# Patient Record
Sex: Female | Born: 1950 | Race: White | Hispanic: No | Marital: Married | Smoking: Former smoker
Health system: Southern US, Community
[De-identification: ages and names within clinical notes are randomized; demographics above are authoritative.]

## PROBLEM LIST (undated history)

## (undated) DIAGNOSIS — K219 Gastro-esophageal reflux disease without esophagitis: Secondary | ICD-10-CM

## (undated) DIAGNOSIS — M797 Fibromyalgia: Secondary | ICD-10-CM

## (undated) DIAGNOSIS — F329 Major depressive disorder, single episode, unspecified: Secondary | ICD-10-CM

## (undated) DIAGNOSIS — R51 Headache: Secondary | ICD-10-CM

## (undated) DIAGNOSIS — I10 Essential (primary) hypertension: Secondary | ICD-10-CM

## (undated) DIAGNOSIS — F32A Depression, unspecified: Secondary | ICD-10-CM

## (undated) DIAGNOSIS — F419 Anxiety disorder, unspecified: Secondary | ICD-10-CM

## (undated) HISTORY — PX: BACK SURGERY: SHX140

---

## 1971-03-27 HISTORY — PX: TUBAL LIGATION: SHX77

## 1993-03-26 HISTORY — PX: CHOLECYSTECTOMY: SHX55

## 1998-07-21 ENCOUNTER — Encounter: Admission: RE | Admit: 1998-07-21 | Discharge: 1998-10-19 | Payer: Self-pay | Admitting: Anesthesiology

## 1998-09-21 ENCOUNTER — Encounter: Payer: Self-pay | Admitting: Orthopedic Surgery

## 1998-09-26 ENCOUNTER — Encounter: Payer: Self-pay | Admitting: Orthopedic Surgery

## 1998-09-26 ENCOUNTER — Inpatient Hospital Stay (HOSPITAL_COMMUNITY): Admission: RE | Admit: 1998-09-26 | Discharge: 1998-09-27 | Payer: Self-pay | Admitting: Orthopedic Surgery

## 2011-04-26 DIAGNOSIS — A088 Other specified intestinal infections: Secondary | ICD-10-CM | POA: Diagnosis not present

## 2011-05-02 DIAGNOSIS — J019 Acute sinusitis, unspecified: Secondary | ICD-10-CM | POA: Diagnosis not present

## 2011-05-02 DIAGNOSIS — Z6826 Body mass index (BMI) 26.0-26.9, adult: Secondary | ICD-10-CM | POA: Diagnosis not present

## 2011-05-02 DIAGNOSIS — A088 Other specified intestinal infections: Secondary | ICD-10-CM | POA: Diagnosis not present

## 2011-08-13 DIAGNOSIS — I10 Essential (primary) hypertension: Secondary | ICD-10-CM | POA: Diagnosis not present

## 2011-08-13 DIAGNOSIS — Z6827 Body mass index (BMI) 27.0-27.9, adult: Secondary | ICD-10-CM | POA: Diagnosis not present

## 2011-08-13 DIAGNOSIS — M064 Inflammatory polyarthropathy: Secondary | ICD-10-CM | POA: Diagnosis not present

## 2011-08-13 DIAGNOSIS — M1990 Unspecified osteoarthritis, unspecified site: Secondary | ICD-10-CM | POA: Diagnosis not present

## 2011-08-13 DIAGNOSIS — E785 Hyperlipidemia, unspecified: Secondary | ICD-10-CM | POA: Diagnosis not present

## 2011-10-04 DIAGNOSIS — Z6828 Body mass index (BMI) 28.0-28.9, adult: Secondary | ICD-10-CM | POA: Diagnosis not present

## 2011-10-04 DIAGNOSIS — IMO0001 Reserved for inherently not codable concepts without codable children: Secondary | ICD-10-CM | POA: Diagnosis not present

## 2011-10-09 DIAGNOSIS — M329 Systemic lupus erythematosus, unspecified: Secondary | ICD-10-CM | POA: Diagnosis not present

## 2011-11-05 DIAGNOSIS — R894 Abnormal immunological findings in specimens from other organs, systems and tissues: Secondary | ICD-10-CM | POA: Diagnosis not present

## 2011-11-05 DIAGNOSIS — IMO0001 Reserved for inherently not codable concepts without codable children: Secondary | ICD-10-CM | POA: Diagnosis not present

## 2011-11-07 DIAGNOSIS — H524 Presbyopia: Secondary | ICD-10-CM | POA: Diagnosis not present

## 2011-11-07 DIAGNOSIS — H2510 Age-related nuclear cataract, unspecified eye: Secondary | ICD-10-CM | POA: Diagnosis not present

## 2011-11-08 DIAGNOSIS — Z6828 Body mass index (BMI) 28.0-28.9, adult: Secondary | ICD-10-CM | POA: Diagnosis not present

## 2011-11-08 DIAGNOSIS — I10 Essential (primary) hypertension: Secondary | ICD-10-CM | POA: Diagnosis not present

## 2011-11-08 DIAGNOSIS — IMO0001 Reserved for inherently not codable concepts without codable children: Secondary | ICD-10-CM | POA: Diagnosis not present

## 2011-12-10 DIAGNOSIS — R5381 Other malaise: Secondary | ICD-10-CM | POA: Diagnosis not present

## 2011-12-10 DIAGNOSIS — D510 Vitamin B12 deficiency anemia due to intrinsic factor deficiency: Secondary | ICD-10-CM | POA: Diagnosis not present

## 2011-12-10 DIAGNOSIS — R5383 Other fatigue: Secondary | ICD-10-CM | POA: Diagnosis not present

## 2011-12-10 DIAGNOSIS — IMO0001 Reserved for inherently not codable concepts without codable children: Secondary | ICD-10-CM | POA: Diagnosis not present

## 2011-12-10 DIAGNOSIS — I10 Essential (primary) hypertension: Secondary | ICD-10-CM | POA: Diagnosis not present

## 2011-12-20 DIAGNOSIS — Z1231 Encounter for screening mammogram for malignant neoplasm of breast: Secondary | ICD-10-CM | POA: Diagnosis not present

## 2011-12-27 DIAGNOSIS — F329 Major depressive disorder, single episode, unspecified: Secondary | ICD-10-CM | POA: Diagnosis not present

## 2011-12-27 DIAGNOSIS — Z6828 Body mass index (BMI) 28.0-28.9, adult: Secondary | ICD-10-CM | POA: Diagnosis not present

## 2011-12-27 DIAGNOSIS — I10 Essential (primary) hypertension: Secondary | ICD-10-CM | POA: Diagnosis not present

## 2011-12-27 DIAGNOSIS — IMO0001 Reserved for inherently not codable concepts without codable children: Secondary | ICD-10-CM | POA: Diagnosis not present

## 2012-01-24 DIAGNOSIS — F329 Major depressive disorder, single episode, unspecified: Secondary | ICD-10-CM | POA: Diagnosis not present

## 2012-01-24 DIAGNOSIS — R002 Palpitations: Secondary | ICD-10-CM | POA: Diagnosis not present

## 2012-01-24 DIAGNOSIS — Z6828 Body mass index (BMI) 28.0-28.9, adult: Secondary | ICD-10-CM | POA: Diagnosis not present

## 2012-01-24 DIAGNOSIS — I10 Essential (primary) hypertension: Secondary | ICD-10-CM | POA: Diagnosis not present

## 2012-04-15 DIAGNOSIS — IMO0001 Reserved for inherently not codable concepts without codable children: Secondary | ICD-10-CM | POA: Diagnosis not present

## 2012-04-15 DIAGNOSIS — E785 Hyperlipidemia, unspecified: Secondary | ICD-10-CM | POA: Diagnosis not present

## 2012-04-15 DIAGNOSIS — Z6829 Body mass index (BMI) 29.0-29.9, adult: Secondary | ICD-10-CM | POA: Diagnosis not present

## 2012-04-15 DIAGNOSIS — M5137 Other intervertebral disc degeneration, lumbosacral region: Secondary | ICD-10-CM | POA: Diagnosis not present

## 2012-04-15 DIAGNOSIS — R609 Edema, unspecified: Secondary | ICD-10-CM | POA: Diagnosis not present

## 2012-04-17 DIAGNOSIS — M47817 Spondylosis without myelopathy or radiculopathy, lumbosacral region: Secondary | ICD-10-CM | POA: Diagnosis not present

## 2012-04-17 DIAGNOSIS — M5137 Other intervertebral disc degeneration, lumbosacral region: Secondary | ICD-10-CM | POA: Diagnosis not present

## 2012-04-17 DIAGNOSIS — R609 Edema, unspecified: Secondary | ICD-10-CM | POA: Diagnosis not present

## 2012-04-25 DIAGNOSIS — IMO0002 Reserved for concepts with insufficient information to code with codable children: Secondary | ICD-10-CM | POA: Diagnosis not present

## 2012-05-22 DIAGNOSIS — IMO0002 Reserved for concepts with insufficient information to code with codable children: Secondary | ICD-10-CM | POA: Diagnosis not present

## 2012-06-19 DIAGNOSIS — IMO0002 Reserved for concepts with insufficient information to code with codable children: Secondary | ICD-10-CM | POA: Diagnosis not present

## 2012-07-07 DIAGNOSIS — IMO0002 Reserved for concepts with insufficient information to code with codable children: Secondary | ICD-10-CM | POA: Diagnosis not present

## 2012-07-25 DIAGNOSIS — J309 Allergic rhinitis, unspecified: Secondary | ICD-10-CM | POA: Diagnosis not present

## 2012-07-25 DIAGNOSIS — F329 Major depressive disorder, single episode, unspecified: Secondary | ICD-10-CM | POA: Diagnosis not present

## 2012-07-25 DIAGNOSIS — E785 Hyperlipidemia, unspecified: Secondary | ICD-10-CM | POA: Diagnosis not present

## 2012-07-25 DIAGNOSIS — Z6829 Body mass index (BMI) 29.0-29.9, adult: Secondary | ICD-10-CM | POA: Diagnosis not present

## 2012-07-25 DIAGNOSIS — E559 Vitamin D deficiency, unspecified: Secondary | ICD-10-CM | POA: Diagnosis not present

## 2012-07-25 DIAGNOSIS — I10 Essential (primary) hypertension: Secondary | ICD-10-CM | POA: Diagnosis not present

## 2012-10-01 DIAGNOSIS — IMO0002 Reserved for concepts with insufficient information to code with codable children: Secondary | ICD-10-CM | POA: Diagnosis not present

## 2012-10-07 DIAGNOSIS — M4712 Other spondylosis with myelopathy, cervical region: Secondary | ICD-10-CM | POA: Diagnosis not present

## 2012-10-07 DIAGNOSIS — IMO0002 Reserved for concepts with insufficient information to code with codable children: Secondary | ICD-10-CM | POA: Diagnosis not present

## 2012-10-09 DIAGNOSIS — M4802 Spinal stenosis, cervical region: Secondary | ICD-10-CM | POA: Diagnosis not present

## 2012-10-13 ENCOUNTER — Other Ambulatory Visit: Payer: Self-pay | Admitting: Orthopedic Surgery

## 2012-10-21 ENCOUNTER — Encounter (HOSPITAL_COMMUNITY): Payer: Self-pay | Admitting: Pharmacy Technician

## 2012-10-23 NOTE — Pre-Procedure Instructions (Signed)
[facility]  10/23/2012   Your procedure is scheduled on: Wednesday, August 6th    Report to [facility] at  9:00 AM.             (Arrival time is per your surgeon's request)   Call this number if you have problems the morning of surgery: [phone]   Remember:   Do not eat food or drink liquids after midnight Tuesday.   Take these medicines the morning of surgery with A SIP OF WATER: Hydrocodone, Metoprolol, Omeprazole, Zyrtec   Do not wear jewelry, make-up or nail polish.  Do not wear lotions, powders, or perfumes. You may NOT wear deodorant.  Do not shave underarms & legs 48 hours prior to surgery.    Do not bring valuables to the hospital.  [facility] is not responsible for any belongings or valuables.  Contacts, dentures or bridgework may not be worn into surgery.   Leave suitcase in the car. After surgery it may be brought to your room.  For patients admitted to the hospital, checkout time is 11:00 AM the day of discharge.   Name and phone number of your driver:    Special Instructions: Shower using CHG 2 nights before surgery and the night before surgery.  If you shower the day of surgery use CHG.  Use special wash - you have one bottle of CHG for all showers.  You should use approximately 1/3 of the bottle for each shower.   Please read over the following fact sheets that you were given: Pain Booklet, Coughing and Deep Breathing, Blood Transfusion Information, MRSA Information and Surgical Site Infection Prevention

## 2012-10-24 ENCOUNTER — Encounter (HOSPITAL_COMMUNITY): Payer: Self-pay

## 2012-10-24 ENCOUNTER — Encounter (HOSPITAL_COMMUNITY)
Admission: RE | Admit: 2012-10-24 | Discharge: 2012-10-24 | Disposition: A | Discharge status: Home / Self Care | Payer: Medicare Other | Source: Ambulatory Visit | Attending: Orthopedic Surgery | Admitting: Orthopedic Surgery

## 2012-10-24 ENCOUNTER — Ambulatory Visit (HOSPITAL_COMMUNITY)
Admission: RE | Admit: 2012-10-24 | Discharge: 2012-10-24 | Disposition: A | Discharge status: Home / Self Care | Payer: Medicare Other | Source: Ambulatory Visit | Attending: Orthopedic Surgery | Admitting: Orthopedic Surgery

## 2012-10-24 DIAGNOSIS — Z01818 Encounter for other preprocedural examination: Secondary | ICD-10-CM | POA: Insufficient documentation

## 2012-10-24 DIAGNOSIS — Z01810 Encounter for preprocedural cardiovascular examination: Secondary | ICD-10-CM | POA: Diagnosis not present

## 2012-10-24 DIAGNOSIS — I10 Essential (primary) hypertension: Secondary | ICD-10-CM | POA: Insufficient documentation

## 2012-10-24 DIAGNOSIS — I498 Other specified cardiac arrhythmias: Secondary | ICD-10-CM | POA: Diagnosis not present

## 2012-10-24 HISTORY — DX: Major depressive disorder, single episode, unspecified: F32.9

## 2012-10-24 HISTORY — DX: Fibromyalgia: M79.7

## 2012-10-24 HISTORY — DX: Depression, unspecified: F32.A

## 2012-10-24 HISTORY — DX: Headache: R51

## 2012-10-24 HISTORY — DX: Anxiety disorder, unspecified: F41.9

## 2012-10-24 HISTORY — DX: Essential (primary) hypertension: I10

## 2012-10-24 HISTORY — DX: Gastro-esophageal reflux disease without esophagitis: K21.9

## 2012-10-24 LAB — CBC WITH DIFFERENTIAL/PLATELET
Eosinophils Relative: 2 % (ref 0–5)
Lymphocytes Relative: 25 % (ref 12–46)
Lymphs Abs: 2.1 10*3/uL (ref 0.7–4.0)
MCV: 88.5 fL (ref 78.0–100.0)
Neutro Abs: 5.4 10*3/uL (ref 1.7–7.7)
Platelets: 251 10*3/uL (ref 150–400)
RBC: 3.47 MIL/uL — ABNORMAL LOW (ref 3.87–5.11)
WBC: 8.3 10*3/uL (ref 4.0–10.5)

## 2012-10-24 LAB — COMPREHENSIVE METABOLIC PANEL
ALT: 6 U/L (ref 0–35)
Alkaline Phosphatase: 81 U/L (ref 39–117)
CO2: 31 mEq/L (ref 19–32)
Chloride: 102 mEq/L (ref 96–112)
GFR calc Af Amer: 56 mL/min — ABNORMAL LOW (ref >90)
GFR calc non Af Amer: 48 mL/min — ABNORMAL LOW (ref >90)
Glucose, Bld: 92 mg/dL (ref 70–99)
Potassium: 5.3 mEq/L — ABNORMAL HIGH (ref 3.5–5.1)
Sodium: 139 mEq/L (ref 135–145)
Total Bilirubin: 0.5 mg/dL (ref 0.3–1.2)

## 2012-10-24 LAB — URINALYSIS, ROUTINE W REFLEX MICROSCOPIC
Glucose, UA: NEGATIVE mg/dL
Specific Gravity, Urine: 1.02 (ref 1.005–1.030)

## 2012-10-24 LAB — SURGICAL PCR SCREEN: MRSA, PCR: NEGATIVE

## 2012-10-24 LAB — URINE MICROSCOPIC-ADD ON

## 2012-10-24 LAB — APTT: aPTT: 30 seconds (ref 24–37)

## 2012-10-27 NOTE — Progress Notes (Signed)
Messages left at both [facility] office with his scheduler and with pt informing them that pt was positive for staph on her PCR and to call in prescription/begin mupirocin. Phone number for short stay left at both places.

## 2012-10-28 MED ORDER — POVIDONE-IODINE 7.5 % EX SOLN
Freq: Once | CUTANEOUS | Status: DC
  Filled 2012-10-28: qty 118

## 2012-10-28 MED ORDER — CEFAZOLIN SODIUM-DEXTROSE 2-3 GM-% IV SOLR
2.0000 g | INTRAVENOUS | Status: AC
  Administered 2012-10-29: 2 g via INTRAVENOUS
  Filled 2012-10-28: qty 50

## 2012-10-29 ENCOUNTER — Inpatient Hospital Stay (HOSPITAL_COMMUNITY): Payer: Medicare Other

## 2012-10-29 ENCOUNTER — Inpatient Hospital Stay (HOSPITAL_COMMUNITY): Payer: Medicare Other | Admitting: Anesthesiology

## 2012-10-29 ENCOUNTER — Encounter (HOSPITAL_COMMUNITY): Payer: Self-pay | Admitting: Anesthesiology

## 2012-10-29 ENCOUNTER — Inpatient Hospital Stay (HOSPITAL_COMMUNITY)
Admission: RE | Admit: 2012-10-29 | Discharge: 2012-11-01 | DRG: 455 | Disposition: A | Discharge status: Home / Self Care | Payer: Medicare Other | Source: Ambulatory Visit | Attending: Orthopedic Surgery | Admitting: Orthopedic Surgery

## 2012-10-29 ENCOUNTER — Encounter (HOSPITAL_COMMUNITY)
Admission: RE | Disposition: A | Discharge status: Home / Self Care | Payer: Self-pay | Source: Ambulatory Visit | Attending: Orthopedic Surgery

## 2012-10-29 ENCOUNTER — Encounter (HOSPITAL_COMMUNITY): Payer: Self-pay | Admitting: *Deleted

## 2012-10-29 DIAGNOSIS — M5137 Other intervertebral disc degeneration, lumbosacral region: Secondary | ICD-10-CM | POA: Diagnosis not present

## 2012-10-29 DIAGNOSIS — M48061 Spinal stenosis, lumbar region without neurogenic claudication: Secondary | ICD-10-CM | POA: Diagnosis not present

## 2012-10-29 DIAGNOSIS — K219 Gastro-esophageal reflux disease without esophagitis: Secondary | ICD-10-CM | POA: Diagnosis not present

## 2012-10-29 DIAGNOSIS — Q762 Congenital spondylolisthesis: Secondary | ICD-10-CM

## 2012-10-29 DIAGNOSIS — F3289 Other specified depressive episodes: Secondary | ICD-10-CM | POA: Diagnosis present

## 2012-10-29 DIAGNOSIS — Z981 Arthrodesis status: Secondary | ICD-10-CM | POA: Diagnosis not present

## 2012-10-29 DIAGNOSIS — Z87891 Personal history of nicotine dependence: Secondary | ICD-10-CM

## 2012-10-29 DIAGNOSIS — IMO0002 Reserved for concepts with insufficient information to code with codable children: Secondary | ICD-10-CM | POA: Diagnosis not present

## 2012-10-29 DIAGNOSIS — IMO0001 Reserved for inherently not codable concepts without codable children: Secondary | ICD-10-CM | POA: Diagnosis present

## 2012-10-29 DIAGNOSIS — M48062 Spinal stenosis, lumbar region with neurogenic claudication: Secondary | ICD-10-CM | POA: Diagnosis not present

## 2012-10-29 DIAGNOSIS — I10 Essential (primary) hypertension: Secondary | ICD-10-CM | POA: Diagnosis not present

## 2012-10-29 DIAGNOSIS — M533 Sacrococcygeal disorders, not elsewhere classified: Secondary | ICD-10-CM | POA: Diagnosis not present

## 2012-10-29 DIAGNOSIS — F329 Major depressive disorder, single episode, unspecified: Secondary | ICD-10-CM | POA: Diagnosis present

## 2012-10-29 DIAGNOSIS — F411 Generalized anxiety disorder: Secondary | ICD-10-CM | POA: Diagnosis present

## 2012-10-29 DIAGNOSIS — Z79899 Other long term (current) drug therapy: Secondary | ICD-10-CM

## 2012-10-29 DIAGNOSIS — M545 Low back pain: Secondary | ICD-10-CM | POA: Diagnosis not present

## 2012-10-29 DIAGNOSIS — M549 Dorsalgia, unspecified: Secondary | ICD-10-CM | POA: Diagnosis not present

## 2012-10-29 DIAGNOSIS — M79609 Pain in unspecified limb: Secondary | ICD-10-CM | POA: Diagnosis not present

## 2012-10-29 DIAGNOSIS — M4310 Spondylolisthesis, site unspecified: Secondary | ICD-10-CM | POA: Diagnosis not present

## 2012-10-29 SURGERY — POSTERIOR LUMBAR FUSION 1 LEVEL
Anesthesia: General | Site: Spine Lumbar | Wound class: Clean

## 2012-10-29 MED ORDER — HYDROMORPHONE HCL PF 1 MG/ML IJ SOLN
INTRAMUSCULAR | Status: AC
  Administered 2012-10-29: 0.5 mg via INTRAVENOUS
  Filled 2012-10-29: qty 1

## 2012-10-29 MED ORDER — MAGNESIUM OXIDE 400 (241.3 MG) MG PO TABS
400.0000 mg | ORAL_TABLET | Freq: Every day | ORAL | Status: DC
  Administered 2012-10-30 – 2012-10-31 (×2): 400 mg via ORAL
  Filled 2012-10-29 (×4): qty 1

## 2012-10-29 MED ORDER — ONDANSETRON HCL 4 MG/2ML IJ SOLN: 4.0000 mg | INTRAMUSCULAR | Status: DC | PRN

## 2012-10-29 MED ORDER — SENNOSIDES-DOCUSATE SODIUM 8.6-50 MG PO TABS: 1.0000 | ORAL_TABLET | Freq: Every evening | ORAL | Status: DC | PRN

## 2012-10-29 MED ORDER — SODIUM CHLORIDE 0.9 % IJ SOLN: 3.0000 mL | INTRAMUSCULAR | Status: DC | PRN

## 2012-10-29 MED ORDER — SUCCINYLCHOLINE CHLORIDE 20 MG/ML IJ SOLN
INTRAMUSCULAR | Status: DC | PRN
  Administered 2012-10-29: 100 mg via INTRAVENOUS

## 2012-10-29 MED ORDER — OXYCODONE HCL 5 MG PO TABS
ORAL_TABLET | ORAL | Status: AC
  Administered 2012-10-29: 5 mg via ORAL
  Filled 2012-10-29: qty 1

## 2012-10-29 MED ORDER — ZOLPIDEM TARTRATE 5 MG PO TABS: 5.0000 mg | ORAL_TABLET | Freq: Every evening | ORAL | Status: DC | PRN

## 2012-10-29 MED ORDER — THROMBIN 20000 UNITS EX SOLR
CUTANEOUS | Status: AC
  Filled 2012-10-29: qty 20000

## 2012-10-29 MED ORDER — ONDANSETRON HCL 4 MG/2ML IJ SOLN: 4.0000 mg | Freq: Four times a day (QID) | INTRAMUSCULAR | Status: DC | PRN

## 2012-10-29 MED ORDER — PREGABALIN 25 MG PO CAPS
75.0000 mg | ORAL_CAPSULE | Freq: Every day | ORAL | Status: DC
  Administered 2012-10-29 – 2012-10-31 (×3): 75 mg via ORAL
  Filled 2012-10-29 (×3): qty 3

## 2012-10-29 MED ORDER — MORPHINE SULFATE (PF) 1 MG/ML IV SOLN
INTRAVENOUS | Status: AC
  Filled 2012-10-29: qty 25

## 2012-10-29 MED ORDER — PROPOFOL 10 MG/ML IV BOLUS
INTRAVENOUS | Status: DC | PRN
  Administered 2012-10-29: 50 mg via INTRAVENOUS
  Administered 2012-10-29: 120 mg via INTRAVENOUS
  Administered 2012-10-29: 30 mg via INTRAVENOUS

## 2012-10-29 MED ORDER — MIDAZOLAM HCL 5 MG/5ML IJ SOLN
INTRAMUSCULAR | Status: DC | PRN
  Administered 2012-10-29: 2 mg via INTRAVENOUS

## 2012-10-29 MED ORDER — DIAZEPAM 5 MG PO TABS
ORAL_TABLET | ORAL | Status: AC
  Administered 2012-10-29: 5 mg via ORAL
  Filled 2012-10-29: qty 1

## 2012-10-29 MED ORDER — OMEPRAZOLE MAGNESIUM 20 MG PO TBEC
20.0000 mg | DELAYED_RELEASE_TABLET | Freq: Every day | ORAL | Status: DC
Start: 2012-10-29 — End: 2012-10-29

## 2012-10-29 MED ORDER — CITALOPRAM HYDROBROMIDE 20 MG PO TABS
20.0000 mg | ORAL_TABLET | Freq: Every day | ORAL | Status: DC
Start: 2012-10-29 — End: 2012-11-01
  Administered 2012-10-30 – 2012-10-31 (×2): 20 mg via ORAL
  Filled 2012-10-29 (×4): qty 1

## 2012-10-29 MED ORDER — DIPHENHYDRAMINE HCL 12.5 MG/5ML PO ELIX: 12.5000 mg | ORAL_SOLUTION | Freq: Four times a day (QID) | ORAL | Status: DC | PRN

## 2012-10-29 MED ORDER — 0.9 % SODIUM CHLORIDE (POUR BTL) OPTIME
TOPICAL | Status: DC | PRN
  Administered 2012-10-29: 1000 mL

## 2012-10-29 MED ORDER — FLEET ENEMA 7-19 GM/118ML RE ENEM: 1.0000 | ENEMA | Freq: Once | RECTAL | Status: AC | PRN

## 2012-10-29 MED ORDER — DIPHENHYDRAMINE HCL 50 MG/ML IJ SOLN: 12.5000 mg | Freq: Four times a day (QID) | INTRAMUSCULAR | Status: DC | PRN

## 2012-10-29 MED ORDER — HYDROMORPHONE HCL PF 1 MG/ML IJ SOLN
0.2500 mg | INTRAMUSCULAR | Status: DC | PRN
  Administered 2012-10-29: 0.5 mg via INTRAVENOUS

## 2012-10-29 MED ORDER — SIMVASTATIN 20 MG PO TABS
20.0000 mg | ORAL_TABLET | Freq: Every day | ORAL | Status: DC
  Administered 2012-10-29 – 2012-10-31 (×3): 20 mg via ORAL
  Filled 2012-10-29 (×4): qty 1

## 2012-10-29 MED ORDER — VANCOMYCIN HCL IN DEXTROSE 1-5 GM/200ML-% IV SOLN
1000.0000 mg | Freq: Once | INTRAVENOUS | Status: AC
  Administered 2012-10-30: 1000 mg via INTRAVENOUS
  Filled 2012-10-29: qty 200

## 2012-10-29 MED ORDER — SODIUM CHLORIDE 0.9 % IV SOLN: 250.0000 mL | INTRAVENOUS | Status: DC

## 2012-10-29 MED ORDER — OXYCODONE HCL 5 MG/5ML PO SOLN: 5.0000 mg | Freq: Once | ORAL | Status: AC | PRN

## 2012-10-29 MED ORDER — LORATADINE 10 MG PO TABS
10.0000 mg | ORAL_TABLET | Freq: Every day | ORAL | Status: DC
  Administered 2012-10-30 – 2012-10-31 (×2): 10 mg via ORAL
  Filled 2012-10-29 (×3): qty 1

## 2012-10-29 MED ORDER — FENTANYL CITRATE 0.05 MG/ML IJ SOLN
INTRAMUSCULAR | Status: DC | PRN
  Administered 2012-10-29 (×5): 50 ug via INTRAVENOUS
  Administered 2012-10-29: 150 ug via INTRAVENOUS
  Administered 2012-10-29: 100 ug via INTRAVENOUS

## 2012-10-29 MED ORDER — SODIUM CHLORIDE 0.9 % IV SOLN
INTRAVENOUS | Status: DC
  Administered 2012-10-30: via INTRAVENOUS

## 2012-10-29 MED ORDER — MORPHINE SULFATE (PF) 1 MG/ML IV SOLN
INTRAVENOUS | Status: DC
  Administered 2012-10-29: 1.5 mg via INTRAVENOUS
  Administered 2012-10-29: 17:00:00 via INTRAVENOUS
  Administered 2012-10-30: 4.5 mg via INTRAVENOUS
  Administered 2012-10-30: 1.5 mg via INTRAVENOUS

## 2012-10-29 MED ORDER — MENTHOL 3 MG MT LOZG: 1.0000 | LOZENGE | OROMUCOSAL | Status: DC | PRN

## 2012-10-29 MED ORDER — MAGNESIUM OXIDE 400 MG PO TABS: 400.0000 mg | ORAL_TABLET | Freq: Every day | ORAL | Status: DC

## 2012-10-29 MED ORDER — SODIUM CHLORIDE 0.9 % IJ SOLN: 9.0000 mL | INTRAMUSCULAR | Status: DC | PRN

## 2012-10-29 MED ORDER — DIAZEPAM 5 MG PO TABS
5.0000 mg | ORAL_TABLET | Freq: Four times a day (QID) | ORAL | Status: DC | PRN
  Filled 2012-10-29: qty 1

## 2012-10-29 MED ORDER — LACTATED RINGERS IV SOLN
INTRAVENOUS | Status: DC
  Administered 2012-10-29: 09:00:00 via INTRAVENOUS

## 2012-10-29 MED ORDER — OXYCODONE-ACETAMINOPHEN 5-325 MG PO TABS
1.0000 | ORAL_TABLET | ORAL | Status: DC | PRN
  Administered 2012-10-30 (×2): 1 via ORAL
  Filled 2012-10-29 (×2): qty 1

## 2012-10-29 MED ORDER — DOCUSATE SODIUM 100 MG PO CAPS
100.0000 mg | ORAL_CAPSULE | Freq: Two times a day (BID) | ORAL | Status: DC
  Administered 2012-10-29 – 2012-10-31 (×5): 100 mg via ORAL
  Filled 2012-10-29 (×7): qty 1

## 2012-10-29 MED ORDER — SODIUM CHLORIDE 0.9 % IJ SOLN
3.0000 mL | Freq: Two times a day (BID) | INTRAMUSCULAR | Status: DC
  Administered 2012-10-31 (×2): 3 mL via INTRAVENOUS

## 2012-10-29 MED ORDER — ACETAMINOPHEN 650 MG RE SUPP: 650.0000 mg | RECTAL | Status: DC | PRN

## 2012-10-29 MED ORDER — PROPOFOL INFUSION 10 MG/ML OPTIME
INTRAVENOUS | Status: DC | PRN
  Administered 2012-10-29: 75 ug/kg/min via INTRAVENOUS

## 2012-10-29 MED ORDER — LACTATED RINGERS IV SOLN
INTRAVENOUS | Status: DC | PRN
  Administered 2012-10-29 (×2): via INTRAVENOUS

## 2012-10-29 MED ORDER — ACETAMINOPHEN 325 MG PO TABS
650.0000 mg | ORAL_TABLET | ORAL | Status: DC | PRN
  Administered 2012-10-30 – 2012-10-31 (×2): 650 mg via ORAL
  Filled 2012-10-29 (×2): qty 2

## 2012-10-29 MED ORDER — LISINOPRIL 20 MG PO TABS
20.0000 mg | ORAL_TABLET | Freq: Every day | ORAL | Status: DC
  Administered 2012-10-29 – 2012-10-30 (×2): 20 mg via ORAL
  Filled 2012-10-29 (×4): qty 1

## 2012-10-29 MED ORDER — PHENOL 1.4 % MT LIQD: 1.0000 | OROMUCOSAL | Status: DC | PRN

## 2012-10-29 MED ORDER — BISACODYL 5 MG PO TBEC: 5.0000 mg | DELAYED_RELEASE_TABLET | Freq: Every day | ORAL | Status: DC | PRN

## 2012-10-29 MED ORDER — MORPHINE SULFATE 2 MG/ML IJ SOLN: 1.0000 mg | INTRAMUSCULAR | Status: DC | PRN

## 2012-10-29 MED ORDER — NALOXONE HCL 0.4 MG/ML IJ SOLN: 0.4000 mg | INTRAMUSCULAR | Status: DC | PRN

## 2012-10-29 MED ORDER — BUPIVACAINE-EPINEPHRINE 0.25% -1:200000 IJ SOLN
INTRAMUSCULAR | Status: DC | PRN
  Administered 2012-10-29: 6 mL

## 2012-10-29 MED ORDER — THROMBIN 20000 UNITS EX SOLR
CUTANEOUS | Status: DC | PRN
  Administered 2012-10-29: 14:00:00 via TOPICAL

## 2012-10-29 MED ORDER — LIDOCAINE HCL (CARDIAC) 20 MG/ML IV SOLN
INTRAVENOUS | Status: DC | PRN
  Administered 2012-10-29: 50 mg via INTRAVENOUS

## 2012-10-29 MED ORDER — METOPROLOL TARTRATE 50 MG PO TABS
50.0000 mg | ORAL_TABLET | Freq: Two times a day (BID) | ORAL | Status: DC
  Administered 2012-10-29 – 2012-10-31 (×4): 50 mg via ORAL
  Filled 2012-10-29 (×7): qty 1

## 2012-10-29 MED ORDER — OXYCODONE HCL 5 MG PO TABS: 5.0000 mg | ORAL_TABLET | Freq: Once | ORAL | Status: AC | PRN

## 2012-10-29 MED ORDER — BUPIVACAINE-EPINEPHRINE PF 0.25-1:200000 % IJ SOLN
INTRAMUSCULAR | Status: AC
  Filled 2012-10-29: qty 30

## 2012-10-29 MED ORDER — ALUM & MAG HYDROXIDE-SIMETH 200-200-20 MG/5ML PO SUSP: 30.0000 mL | Freq: Four times a day (QID) | ORAL | Status: DC | PRN

## 2012-10-29 MED ORDER — POTASSIUM GLUCONATE 595 (99 K) MG PO TABS: 595.0000 mg | ORAL_TABLET | Freq: Every day | ORAL | Status: DC

## 2012-10-29 SURGICAL SUPPLY — 86 items
APL SKNCLS STERI-STRIP NONHPOA (GAUZE/BANDAGES/DRESSINGS) ×2
BENZOIN TINCTURE PRP APPL 2/3 (GAUZE/BANDAGES/DRESSINGS) ×4 IMPLANT
BLADE SURG ROTATE 9660 (MISCELLANEOUS) IMPLANT
BUR ROUND PRECISION 4.0 (BURR) ×2 IMPLANT
CARTRIDGE OIL MAESTRO DRILL (MISCELLANEOUS) ×1 IMPLANT
CLOTH BEACON ORANGE TIMEOUT ST (SAFETY) ×2 IMPLANT
CLSR STERI-STRIP ANTIMIC 1/2X4 (GAUZE/BANDAGES/DRESSINGS) ×4 IMPLANT
CONT SPEC STER OR (MISCELLANEOUS) ×2 IMPLANT
CORDS BIPOLAR (ELECTRODE) ×2 IMPLANT
COVER SURGICAL LIGHT HANDLE (MISCELLANEOUS) ×2 IMPLANT
DIFFUSER DRILL AIR PNEUMATIC (MISCELLANEOUS) ×2 IMPLANT
DRAIN CHANNEL 15F RND FF W/TCR (WOUND CARE) IMPLANT
DRAPE C-ARM 42X72 X-RAY (DRAPES) ×2 IMPLANT
DRAPE C-ARMOR (DRAPES) ×2 IMPLANT
DRAPE ORTHO SPLIT 77X108 STRL (DRAPES) ×2
DRAPE POUCH INSTRU U-SHP 10X18 (DRAPES) ×2 IMPLANT
DRAPE SURG 17X23 STRL (DRAPES) ×6 IMPLANT
DRAPE SURG ORHT 6 SPLT 77X108 (DRAPES) ×1 IMPLANT
DURAPREP 26ML APPLICATOR (WOUND CARE) ×2 IMPLANT
ELECT BLADE 4.0 EZ CLEAN MEGAD (MISCELLANEOUS) ×2
ELECT CAUTERY BLADE 6.4 (BLADE) ×2 IMPLANT
ELECT REM PT RETURN 9FT ADLT (ELECTROSURGICAL) ×2
ELECTRODE BLDE 4.0 EZ CLN MEGD (MISCELLANEOUS) ×1 IMPLANT
ELECTRODE REM PT RTRN 9FT ADLT (ELECTROSURGICAL) ×1 IMPLANT
EVACUATOR SILICONE 100CC (DRAIN) IMPLANT
GAUZE SPONGE 4X4 16PLY XRAY LF (GAUZE/BANDAGES/DRESSINGS) ×8 IMPLANT
GLOVE BIO SURGEON STRL SZ7 (GLOVE) ×2 IMPLANT
GLOVE BIO SURGEON STRL SZ8 (GLOVE) ×2 IMPLANT
GLOVE BIOGEL PI IND STRL 7.5 (GLOVE) ×1 IMPLANT
GLOVE BIOGEL PI IND STRL 8 (GLOVE) ×1 IMPLANT
GLOVE BIOGEL PI INDICATOR 7.5 (GLOVE) ×1
GLOVE BIOGEL PI INDICATOR 8 (GLOVE) ×1
GOWN STRL NON-REIN LRG LVL3 (GOWN DISPOSABLE) ×4 IMPLANT
GOWN STRL REIN XL XLG (GOWN DISPOSABLE) ×2 IMPLANT
GUIDEWIRE SHARP VIPER II (WIRE) ×4 IMPLANT
IMPL COROENT XL 10X18X55 (Intraocular Lens) ×1 IMPLANT
IMPLANT COROENT XL 10X18X55 (Intraocular Lens) ×2 IMPLANT
IV CATH 14GX2 1/4 (CATHETERS) ×2 IMPLANT
KIT BASIN OR (CUSTOM PROCEDURE TRAY) ×2 IMPLANT
KIT DILATOR XLIF 5 (KITS) ×4 IMPLANT
KIT MAXCESS (KITS) ×2 IMPLANT
KIT NEEDLE NVM5 EMG ELECT (KITS) ×1 IMPLANT
KIT NEEDLE NVM5 EMG ELECTRODE (KITS) ×1
KIT POSITION SURG JACKSON T1 (MISCELLANEOUS) ×2 IMPLANT
KIT ROOM TURNOVER OR (KITS) ×2 IMPLANT
KIT XLIF (KITS) ×4
MARKER SKIN DUAL TIP RULER LAB (MISCELLANEOUS) ×4 IMPLANT
NEEDLE BONE MARROW 8GX6 FENEST (NEEDLE) IMPLANT
NEEDLE HYPO 25GX1X1/2 BEV (NEEDLE) ×2 IMPLANT
NEEDLE JAMSHIDI VIPER (NEEDLE) ×2 IMPLANT
NEEDLE SPNL 18GX3.5 QUINCKE PK (NEEDLE) ×4 IMPLANT
NS IRRIG 1000ML POUR BTL (IV SOLUTION) ×2 IMPLANT
OIL CARTRIDGE MAESTRO DRILL (MISCELLANEOUS) ×2
PACK LAMINECTOMY ORTHO (CUSTOM PROCEDURE TRAY) ×2 IMPLANT
PACK UNIVERSAL I (CUSTOM PROCEDURE TRAY) ×2 IMPLANT
PAD ARMBOARD 7.5X6 YLW CONV (MISCELLANEOUS) ×4 IMPLANT
PATTIES SURGICAL .5 X1 (DISPOSABLE) ×2 IMPLANT
PATTIES SURGICAL .5X1.5 (GAUZE/BANDAGES/DRESSINGS) ×2 IMPLANT
PUTTY BONE DBX 2.5 MIS (Bone Implant) ×2 IMPLANT
PUTTY BONE DBX 5CC MIX (Putty) ×2 IMPLANT
ROD VIPER II LORDOSED 5.5X35 (Rod) ×2 IMPLANT
SCREW SET SINGLE INNER MIS (Screw) ×4 IMPLANT
SCREW XTAB POLY VIPER  6X45 (Screw) ×2 IMPLANT
SCREW XTAB POLY VIPER 6X45 (Screw) ×2 IMPLANT
SPONGE GAUZE 4X4 12PLY (GAUZE/BANDAGES/DRESSINGS) ×4 IMPLANT
SPONGE INTESTINAL PEANUT (DISPOSABLE) ×6 IMPLANT
SPONGE SURGIFOAM ABS GEL 100 (HEMOSTASIS) ×2 IMPLANT
STAPLER VISISTAT 35W (STAPLE) ×2 IMPLANT
STRIP CLOSURE SKIN 1/2X4 (GAUZE/BANDAGES/DRESSINGS) ×4 IMPLANT
SURGIFLO TRUKIT (HEMOSTASIS) IMPLANT
SUT MNCRL AB 4-0 PS2 18 (SUTURE) ×4 IMPLANT
SUT VIC AB 0 CT1 18XCR BRD 8 (SUTURE) ×1 IMPLANT
SUT VIC AB 0 CT1 8-18 (SUTURE) ×2
SUT VIC AB 1 CT1 18XCR BRD 8 (SUTURE) ×2 IMPLANT
SUT VIC AB 1 CT1 8-18 (SUTURE) ×4
SUT VIC AB 2-0 CT2 18 VCP726D (SUTURE) ×2 IMPLANT
SYR 20CC LL (SYRINGE) ×2 IMPLANT
SYR BULB IRRIGATION 50ML (SYRINGE) ×2 IMPLANT
SYR CONTROL 10ML LL (SYRINGE) ×2 IMPLANT
TAP CANN VIPER2 DL 5.0 (TAP) ×2 IMPLANT
TAPE CLOTH SURG 4X10 WHT LF (GAUZE/BANDAGES/DRESSINGS) ×4 IMPLANT
TOWEL OR 17X24 6PK STRL BLUE (TOWEL DISPOSABLE) ×2 IMPLANT
TOWEL OR 17X26 10 PK STRL BLUE (TOWEL DISPOSABLE) ×2 IMPLANT
TRAY FOLEY CATH 16FRSI W/METER (SET/KITS/TRAYS/PACK) ×2 IMPLANT
WATER STERILE IRR 1000ML POUR (IV SOLUTION) ×2 IMPLANT
YANKAUER SUCT BULB TIP NO VENT (SUCTIONS) ×2 IMPLANT

## 2012-10-29 NOTE — Anesthesia Postprocedure Evaluation (Signed)
  Anesthesia Post-op Note  Patient: [name]  Procedure(s) Performed: Procedure(s) with comments: Lumbar 4-5 lateral interbody fusion with allograft and posterior spinal fusion with instrumentation./POSTERIOR LUMBAR FUSION 1 LEVEL (N/A) - Lumbar 4-5 lateral interbody fusion with allograft and posterior spinal fusion with instrumentation.  Patient Location: PACU  Anesthesia Type:General  Level of Consciousness: awake  Airway and Oxygen Therapy: Patient Spontanous Breathing  Post-op Pain: mild  Post-op Assessment: Post-op Vital signs reviewed  Post-op Vital Signs: Reviewed  Complications: No apparent anesthesia complications

## 2012-10-29 NOTE — Plan of Care (Signed)
Problem: Consults Goal: Diagnosis - Spinal Surgery Lumbar Laminectomy (Complex) PLIF L4-5

## 2012-10-29 NOTE — Preoperative (Signed)
Beta Blockers   Reason not to administer Beta Blockers:Not Applicable 

## 2012-10-29 NOTE — Transfer of Care (Signed)
Immediate Anesthesia Transfer of Care Note  Patient: [name]  Procedure(s) Performed: Procedure(s) with comments: Lumbar 4-5 lateral interbody fusion with allograft and posterior spinal fusion with instrumentation./POSTERIOR LUMBAR FUSION 1 LEVEL (N/A) - Lumbar 4-5 lateral interbody fusion with allograft and posterior spinal fusion with instrumentation.  Patient Location: PACU  Anesthesia Type:General  Level of Consciousness: awake  Airway & Oxygen Therapy: Patient Spontanous Breathing and Patient connected to nasal cannula oxygen  Post-op Assessment: Report given to PACU RN and Post -op Vital signs reviewed and stable  Post vital signs: Reviewed and stable  Complications: No apparent anesthesia complications

## 2012-10-29 NOTE — Anesthesia Preprocedure Evaluation (Signed)
Anesthesia Evaluation  Patient identified by MRN, date of birth, ID band Patient awake    Reviewed: Allergy & Precautions, H&P , NPO status , Patient's Chart, lab work & pertinent test results  Airway Mallampati: II  Neck ROM: full    Dental   Pulmonary former smoker,          Cardiovascular hypertension,     Neuro/Psych  Headaches, Anxiety Depression  Neuromuscular disease    GI/Hepatic GERD-  ,  Endo/Other    Renal/GU      Musculoskeletal  (+) Fibromyalgia -  Abdominal   Peds  Hematology   Anesthesia Other Findings   Reproductive/Obstetrics                           Anesthesia Physical Anesthesia Plan  ASA: III  Anesthesia Plan: General   Post-op Pain Management:    Induction: Intravenous  Airway Management Planned: Oral ETT  Additional Equipment:   Intra-op Plan:   Post-operative Plan: Extubation in OR  Informed Consent: I have reviewed the patients History and Physical, chart, labs and discussed the procedure including the risks, benefits and alternatives for the proposed anesthesia with the patient or authorized representative who has indicated his/her understanding and acceptance.     Plan Discussed with: CRNA, Anesthesiologist and Surgeon  Anesthesia Plan Comments:         Anesthesia Quick Evaluation

## 2012-10-29 NOTE — H&P (Signed)
PREOPERATIVE H&P  Chief Complaint: left leg pain  HPI: [name] is a 62 y.o. female who presents with ongoing left leg pain. S/p 2 previous lumbar decompressions. 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 Patient has failed multiple forms of conservative care.  Past Medical History  Diagnosis Date  . Hypertension   . GERD (gastroesophageal reflux disease)   . Headache(784.0)     last one was 'acouple' yrs  . Anxiety   . Depression     on meds for 'acouple of yrs now"  . Fibromyalgia    Past Surgical History  Procedure Laterality Date  . Back surgery  2000 & 2001  . Cholecystectomy  1995  . Tubal ligation  1973   History   Social History  . Marital Status: Married    Spouse Name: N/A    Number of Children: N/A  . Years of Education: N/A   Social History Main Topics  . Smoking status: Former Smoker -- 1.00 packs/day for 40 years    Types: Cigarettes    Quit date: 03/26/2008  . Smokeless tobacco: Not on file  . Alcohol Use: No  . Drug Use: No  . Sexually Active: Not on file   Other Topics Concern  . Not on file   Social History Narrative  . No narrative on file   No family history on file. Allergies  Allergen Reactions  . Penicillins Other (See Comments)    Causes thrush  . Other Rash    Ivory soap causes rash   Prior to Admission medications   Medication Sig Start Date End Date Taking? Authorizing Provider  Calcium Carb-Cholecalciferol (CALCIUM-VITAMIN D3) 600-500 MG-UNIT CAPS Take 1 capsule by mouth daily.   Yes Historical Provider, MD  cetirizine (ZYRTEC) 10 MG tablet Take 10 mg by mouth 2 (two) times daily.   Yes Historical Provider, MD  citalopram (CELEXA) 20 MG tablet Take 20 mg by mouth daily.   Yes Historical Provider, MD  HYDROcodone-acetaminophen (NORCO) 10-325 MG per tablet Take 1 tablet by mouth every 6 (six) hours as needed for pain.   Yes Historical Provider, MD  lisinopril (PRINIVIL,ZESTRIL) 20 MG tablet Take 20 mg by mouth at  bedtime.   Yes Historical Provider, MD  magnesium oxide (MAG-OX) 400 MG tablet Take 400 mg by mouth daily.   Yes Historical Provider, MD  metoprolol (LOPRESSOR) 50 MG tablet Take 50 mg by mouth 2 (two) times daily.   Yes Historical Provider, MD  Multiple Vitamin (MULTIVITAMIN WITH MINERALS) TABS Take 1 tablet by mouth daily. Women's over 50   Yes Historical Provider, MD  Omega-3 Fatty Acids (FISH OIL PO) Take 1,290 mg by mouth 2 (two) times daily.   Yes Historical Provider, MD  omeprazole (PRILOSEC OTC) 20 MG tablet Take 20 mg by mouth daily.   Yes Historical Provider, MD  OVER THE COUNTER MEDICATION Take 1 tablet by mouth at bedtime. Protandim (dietary supplement)   Yes Historical Provider, MD  potassium gluconate 595 MG TABS Take 595 mg by mouth daily.   Yes Historical Provider, MD  pregabalin (LYRICA) 75 MG capsule Take 75 mg by mouth at bedtime.   Yes Historical Provider, MD  simvastatin (ZOCOR) 20 MG tablet Take 20 mg by mouth at bedtime.   Yes Historical Provider, MD     All other systems have been reviewed and were otherwise negative with the exception of those mentioned in the HPI and as above.  Physical Exam: There were no vitals  filed for this visit.  General: Alert, no acute distress Cardiovascular: No pedal edema Respiratory: No cyanosis, no use of accessory musculature Skin: No lesions in the area of chief complaint Neurologic: Sensation intact distally Psychiatric: Patient is competent for consent with normal mood and affect Lymphatic: No axillary or cervical lymphadenopathy  MUSCULOSKELETAL: + SLR on left  Assessment/Plan: left leg pain Plan for Procedure(s): Lumbar 4-5 lateral interbody fusion with allograft and posterior spinal fusion with instrumentation./POSTERIOR LUMBAR FUSION 1 LEVEL   Emilee Hero, MD 10/29/2012 8:09 AM

## 2012-10-30 MED ORDER — METHOCARBAMOL 500 MG PO TABS
500.0000 mg | ORAL_TABLET | Freq: Four times a day (QID) | ORAL | Status: DC
  Administered 2012-10-30 – 2012-11-01 (×7): 500 mg via ORAL
  Filled 2012-10-30 (×12): qty 1

## 2012-10-30 MED ORDER — OXYCODONE HCL ER 10 MG PO T12A
10.0000 mg | EXTENDED_RELEASE_TABLET | Freq: Two times a day (BID) | ORAL | Status: DC
  Administered 2012-10-30 – 2012-10-31 (×3): 10 mg via ORAL
  Filled 2012-10-30 (×3): qty 1

## 2012-10-30 MED FILL — Sodium Chloride IV Soln 0.9%: INTRAVENOUS | Qty: 1000 | Status: AC

## 2012-10-30 MED FILL — Heparin Sodium (Porcine) Inj 1000 Unit/ML: INTRAMUSCULAR | Qty: 30 | Status: AC

## 2012-10-30 NOTE — Evaluation (Signed)
Physical Therapy Evaluation Patient Details Name: [name] MRN: [id number] DOB: [birth year] Today's Date: 10/30/2012 Time: 4098-1191 PT Time Calculation (min): 31 min  PT Assessment / Plan / Recommendation History of Present Illness  pt with PLIF L4-5, lumbar laminectomy  Clinical Impression  Pt. Making good progress first time out of bed but needs ongoing acute PT to address her decrease in functional mobility and gait.  May need HHPT but this depends on how she progresses over next day or so.    PT Assessment  Patient needs continued PT services    Follow Up Recommendations  Home health PT;Supervision/Assistance - 24 hour;Supervision for mobility/OOB (need for HHPT depends on her progress)    Does the patient have the potential to tolerate intense rehabilitation      Barriers to Discharge        Equipment Recommendations  Rolling walker with 5" wheels (defer toilet/bath  equipment to OT)    Recommendations for Other Services     Frequency Min 6X/week    Precautions / Restrictions Precautions Precautions: Back Precaution Booklet Issued: Yes (comment) Precaution Comments: Pt educated on back precautions and log rolling technique and provided with handout Required Braces or Orthoses:  (no brace orders in chart ; no brace in room) Restrictions Weight Bearing Restrictions: No   Pertinent Vitals/Pain See vitals tab       Mobility  Bed Mobility Bed Mobility: Rolling Left;Left Sidelying to Sit;Sitting - Scoot to [place] of Bed Rolling Left: 4: Min assist;With rail Left Sidelying to Sit: 4: Min assist;With rails Sitting - Scoot to [place] of Bed: 4: Min guard Details for Bed Mobility Assistance: cues for technique and to maintain back precautions Transfers Transfers: Sit to Stand;Stand to Sit Sit to Stand: 4: Min assist;From bed;With upper extremity assist;From chair/3-in-1;With armrests Stand to Sit: To chair/3-in-1;4: Min assist;Without upper extremity assist Details  for Transfer Assistance: cues for correct hand placement and postioning of LEs Ambulation/Gait Ambulation/Gait Assistance: 4: Min assist Ambulation Distance (Feet): 20 Feet Assistive device: Rolling walker Ambulation/Gait Assistance Details: cues for erect posture and for pushing instead of picking up of RW, min assist for safety and stability Gait Pattern: Step-to pattern;Decreased step length - right;Decreased step length - left Gait velocity: decreased Stairs: No    Exercises     PT Diagnosis: Difficulty walking;Acute pain  PT Problem List: Decreased activity tolerance;Decreased mobility;Decreased knowledge of use of DME;Decreased knowledge of precautions;Pain;Decreased balance PT Treatment Interventions: DME instruction;Gait training;Stair training;Functional mobility training;Therapeutic activities;Patient/family education;Balance training     PT Goals(Current goals can be found in the care plan section) Acute Rehab PT Goals Patient Stated Goal: " to return home " PT Goal Formulation: With patient Time For Goal Achievement: 11/06/12 Potential to Achieve Goals: Good  Visit Information  Last PT Received On: 10/30/12 Assistance Needed: +1 PT/OT Co-Evaluation/Treatment: Yes History of Present Illness: pt with PLIF L4-5, lumbar laminectomy       Prior Functioning  Home Living Family/patient expects to be discharged to:: Private residence Living Arrangements: Spouse/significant other;Children Available Help at Discharge: Family Type of Home: Mobile home Home Access: Stairs to enter Secretary/administrator of Steps: 4 Entrance Stairs-Rails: Left;Right Home Layout: One level Home Equipment: Cane - single point Prior Function Level of Independence: Independent Communication Communication: No difficulties Dominant Hand: Right    Cognition  Cognition Arousal/Alertness: Awake/alert Behavior During Therapy: WFL for tasks assessed/performed Overall Cognitive Status: Within  Functional Limits for tasks assessed    Extremity/Trunk Assessment Upper Extremity Assessment Upper Extremity  Assessment: Defer to OT evaluation Lower Extremity Assessment Lower Extremity Assessment: Overall WFL for tasks assessed   Balance Balance Balance Assessed: Yes Static Standing Balance Static Standing - Balance Support: No upper extremity supported;During functional activity Static Standing - Level of Assistance: 5: Stand by assistance Dynamic Standing Balance Dynamic Standing - Balance Support: No upper extremity supported;During functional activity Dynamic Standing - Level of Assistance: 4: Min assist  End of Session PT - End of Session Equipment Utilized During Treatment: Gait belt Activity Tolerance: Patient tolerated treatment well Patient left: in chair;with call bell/phone within reach Nurse Communication: Mobility status;Other (comment) (pt. able to urinate on 3n1 over toilet)  GP     Ferman Hamming 10/30/2012, 3:48 PM Weldon Picking PT Acute Rehab Services [phone] Beeper [phone]

## 2012-10-30 NOTE — Op Note (Signed)
[name], [name]                ACCOUNT NO.:  [id number]  MEDICAL RECORD NO.:  [id number]  LOCATION:  [id number]                        FACILITY:  [facility]  PHYSICIAN:  Estill Bamberg, MD      DATE OF BIRTH:  December 22, 1950  DATE OF PROCEDURE:  10/29/2012                              OPERATIVE REPORT   PREOPERATIVE DIAGNOSES: 1. 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 2. Left-sided L4 radiculopathy. 3. Status post total of 2 previous lumbar decompressive procedures. 4. Grade 1 L4-5 spondylolisthesis.  POSTOPERATIVE DIAGNOSES: 1. 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 2. Left-sided L4 radiculopathy. 3. Status post total of 2 previous lumbar decompressive procedures. 4. Grade 1 L4-5 spondylolisthesis.  PROCEDURES: 1. Anterior lumbar interbody fusion, L4-5, via a lateral     retroperitoneal approach. 2. Insertion of interbody device x1 (10 x 50 mm NuVasive interbody     cage). 3. Use of morselized allograft. 4. Placement of posterior instrumentation, L4-L5. 5. Posterolateral fusion, L4-5.  SURGEON:  Estill Bamberg, MD  ASSISTANTS:  Jason Coop, PA-C  ANESTHESIA:  General endotracheal anesthesia.  COMPLICATIONS:  None.  DISPOSITION:  Stable.  ESTIMATED BLOOD LOSS:  50 mL.  INDICATIONS FOR PROCEDURE:  Briefly, Ms. Jaffer is a very pleasant 62- year-old female who did initially present to me on October 07, 2012.  The patient was complaining of severe and ongoing pain in her left leg.  The patient did have multiple epidural injections.  She did get temporary relief with the injections, however, her pain did recur.  The distribution of her pain was in the dermatome of L4.  The patient's MRI did reveal severe left-sided L4-5 foraminal stenosis.  The patient was also noted to have an anterolisthesis at L4-5.  Given her ongoing pain and ongoing functional limitations, we did have a discussion regarding going forward with a procedure reflected above.  The patient  fully understood the risks and limitations of the procedure as outlined in my preoperative note.  OPERATIVE DETAILS:  On 10/29/2012, the patient was brought to surgery and general endotracheal anesthesia was administered.  The patient was placed in the lateral decubitus position with the right side up.  The patient was secured to the bed.  All bony prominences were meticulously padded.  I did use neurologic monitoring throughout the procedure and neurologic monitoring leads were placed prior to positioning.  A time- out procedure was performed.  Antibiotics were given.  I then marked out the level of the L4-5 interspace.  A small 2-cm transverse incision was made overlying the L4-5 interspace.  The external and internal oblique musculature was bluntly dissected.  The transversalis fascia was identified and entered using Metzenbaum.  The retroperitoneal fat was readily identified.  I then was able to dock the initial dilator through the psoas over the L4-5 intervertebral space.  Of note, I did use neurologic monitoring while docking the initial dilator.  There were no neurologic structures noted to be in the vicinity of the initial dilator.  I then sequentially dilated over the initial dilator.  It was identified that there were neurologic structures posterior to the biggest dilator, as expected.  A self-retaining retractor was then placed over the biggest dilator, and the  retractor was attached to the bed.  I did obtain AP and lateral views to confirm appropriate positioning of the retractor.  Using a 15-blade knife, I performed an annulotomy at the lateral aspect of the intervertebral space.  I did use a Cobb to release the annulus on the contralateral side.  I then went forward with a thorough and complete diskectomy using a series of curettes, Kerrison punches, and pituitary rongeurs.  I was very pleased with the diskectomy performed.  I did ensure not to violate the endplates.  I  then placed a series of trials.  I did feel that a 10-mm interbody implant would be the most appropriate fit.  The implant was packed with DBX mix and tamped into position in the usual fashion.  I was very pleased with the final press fit of the implant.  Appropriate position was confirmed on both AP and lateral radiographs.  I did place the implant at the anterior aspect of the intervertebral space, to help optimize the degree of lordosis.  I was able to restore the intervertebral height by placement of the implant.  I was very pleased with the final appearance.  At this point, the retractor was removed and the wound was copiously irrigated.  I then closed the fascia using #1 Vicryl.  The subcutaneous layer was closed using 2-0 Vicryl and the skin was closed using 3-0 Monocryl.  I then marked out the lateral border of the pedicle screws on the right at L4 and at L5.  I then used Jamshidi needles to advance the guidewire across the pedicle on the right side, at the L4 and L5 pedicles.  I then tapped over the guidewires and I did place 6 x 45 mm screws at L4 and at L5.  At this point, I did expose the transverse processes of L4 and L5.  I did use a high-speed burr to decorticate the transverse processes and the remainder of the allograft was packed into the posterolateral gutter to help aid in fusion.  I then secured a 35-mm rod across the L4 and L5 pedicle screws.  Caps were placed over the rods.  I then performed a compression maneuver across the screws and a final locking procedure was performed over each of the screws.  I was very pleased with the final appearance on both AP and lateral radiographs.  I then copiously irrigated the wound.  The fascia was then closed using #1 Vicryl.  The subcutaneous layer was closed using 2-0 Vicryl and the skin was closed using 3-0 Monocryl.  Benzoin and Steri-Strips were applied followed by sterile dressing.  All instrument counts were correct at the  termination of the procedure.     Estill Bamberg, MD     MD/MEDQ  D:  10/29/2012  T:  10/30/2012  Job:  [id number]  cc:   Foye Deer, MD

## 2012-10-30 NOTE — Progress Notes (Signed)
Patient s/p 4/5 XLIF doing well. Patient denies LBP. Left leg pain resolved.  BP 117/51  Pulse 68  Temp(Src) 100 F (37.8 C) (Oral)  Resp 16  SpO2 100%  Patient looks well - eating breakfast in her bed presently Dressing CDI NVI  POD #1 after 4/5 XLIF, doing well  - ambulate with PT/OT this AM - d/c PCA and foley - start oxycontin and robaxin - SCDs, Percocet

## 2012-10-30 NOTE — Evaluation (Signed)
Occupational Therapy Evaluation Patient Details Name: [name] MRN: [id number] DOB: October 16, 1950 Today's Date: 10/30/2012 Time: 4098-1191 OT Time Calculation (min): 31 min  OT Assessment / Plan / Recommendation History of present illness pt with PLIF L4-5, lumbar laminectomy   Clinical Impression   Pt demos decline in function with ADLs and ADL mobility safety following back surgery. Pt would benefit from acute OT services to address impairments to help restore PLOF to return home safely    OT Assessment  Patient needs continued OT Services    Follow Up Recommendations  Home health OT;Supervision/Assistance - 24 hour    Barriers to Discharge   none  Equipment Recommendations       Recommendations for Other Services    Frequency  Min 2X/week    Precautions / Restrictions Precautions Precautions: Back Precaution Booklet Issued: Yes (comment) Precaution Comments: Pt educated on back precautions and provided with handout Restrictions Weight Bearing Restrictions: No   Pertinent Vitals/Pain 3/10 back    ADL  Grooming: Performed;Wash/dry hands;Wash/dry face;Min guard Where Assessed - Grooming: Supported standing Upper Body Bathing: Simulated;Supervision/safety;Set up Lower Body Bathing: Simulated;Moderate assistance Upper Body Dressing: Performed;Set up;Supervision/safety Lower Body Dressing: Maximal assistance Toilet Transfer: Performed;Minimal assistance Toilet Transfer Method: Sit to stand Toilet Transfer Equipment: Grab bars;Raised toilet seat with arms (or 3-in-1 over toilet) Where Assessed - Toileting Clothing Manipulation and Hygiene: Standing Tub/Shower Transfer: Moderate assistance Tub/Shower Transfer Method: Not assessed Equipment Used: Gait belt;Rolling walker;Other (comment) (3 in 1 over toilet) Transfers/Ambulation Related to ADLs: cues for safety ADL Comments: pt provided with education and demo of ADL A/E for use at home    OT Diagnosis: Generalized  weakness;Acute pain  OT Problem List: Decreased strength;Decreased knowledge of use of DME or AE;Decreased activity tolerance;Impaired balance (sitting and/or standing);Pain OT Treatment Interventions: Self-care/ADL training;Therapeutic exercise;Patient/family education;Neuromuscular education;Balance training;DME and/or AE instruction;Therapeutic activities   OT Goals(Current goals can be found in the care plan section) Acute Rehab OT Goals Patient Stated Goal: " to return home " OT Goal Formulation: With patient Time For Goal Achievement: 11/06/12 Potential to Achieve Goals: Good ADL Goals Pt Will Perform Grooming: with set-up;with supervision;standing Pt Will Perform Lower Body Bathing: with min assist;with adaptive equipment;sitting/lateral leans;sit to/from stand Pt Will Perform Lower Body Dressing: with min assist;with mod assist;with adaptive equipment;sitting/lateral leans;sit to/from stand Pt Will Transfer to Toilet: with min guard assist;with supervision;ambulating;grab bars (3 in 1) Pt Will Perform Toileting - Clothing Manipulation and hygiene: with min guard assist Pt Will Perform Tub/Shower Transfer: with min guard assist;grab bars;shower seat;3 in 1  Visit Information  Last OT Received On: 10/30/12 Assistance Needed: +1 History of Present Illness: pt with PLIF L4-5, lumbar laminectomy       Prior Functioning     Home Living Family/patient expects to be discharged to:: Private residence Living Arrangements: Spouse/significant other;Children Available Help at Discharge: Family Type of Home: Mobile home Home Access: Stairs to enter Secretary/administrator of Steps: 4 Entrance Stairs-Rails: Left;Right Home Layout: One level Home Equipment: Cane - single point Prior Function Level of Independence: Independent Communication Communication: No difficulties Dominant Hand: Right         Vision/Perception Vision - History Baseline Vision: Wears glasses all the  time Patient Visual Report: No change from baseline Perception Perception: Within Functional Limits   Cognition  Cognition Arousal/Alertness: Awake/alert Behavior During Therapy: WFL for tasks assessed/performed Overall Cognitive Status: Within Functional Limits for tasks assessed    Extremity/Trunk Assessment Upper Extremity Assessment Upper Extremity Assessment: Overall [facility]  for tasks assessed     Mobility Bed Mobility Bed Mobility: Rolling Left;Left Sidelying to Sit;Sitting - Scoot to Edge of Bed Rolling Left: 4: Min assist;With rail Left Sidelying to Sit: 4: Min assist;With rails Sitting - Scoot to [place] of Bed: 4: Min guard Transfers Transfers: Sit to Stand;Stand to Sit Sit to Stand: 4: Min assist;From chair/3-in-1;From bed;With upper extremity assist Stand to Sit: To chair/3-in-1;4: Min assist;Without upper extremity assist Details for Transfer Assistance: cues for correct hand placement and postioning of LEs     Exercise     Balance Balance Balance Assessed: Yes Static Standing Balance Static Standing - Balance Support: No upper extremity supported;During functional activity Static Standing - Level of Assistance: 5: Stand by assistance Dynamic Standing Balance Dynamic Standing - Balance Support: No upper extremity supported;During functional activity Dynamic Standing - Level of Assistance: 4: Min assist   End of Session OT - End of Session Equipment Utilized During Treatment: Gait belt;Rolling walker;Other (comment) (3 in 1) Activity Tolerance: Patient tolerated treatment well Patient left: in chair;with call bell/phone within reach;with family/visitor present  GO     Galen Manila 10/30/2012, 2:42 PM

## 2012-10-30 NOTE — Progress Notes (Signed)
PCA morphine discontinued. Will start on PO pain med and muscle relaxer at this time.

## 2012-10-30 NOTE — Plan of Care (Signed)
Problem: Phase II Progression Outcomes Goal: Discharge plan established Recommend HH OT for ADLs and ADL mobility safety trg after acute care d/c

## 2012-10-30 NOTE — Progress Notes (Signed)
UR COMPLETED  

## 2012-10-31 MED ORDER — OXYCODONE-ACETAMINOPHEN 5-325 MG PO TABS
1.0000 | ORAL_TABLET | ORAL | Status: DC | PRN
  Administered 2012-11-01 (×2): 1 via ORAL
  Filled 2012-10-31 (×2): qty 1

## 2012-10-31 MED ORDER — HYDROCODONE-ACETAMINOPHEN 5-325 MG PO TABS
1.0000 | ORAL_TABLET | ORAL | Status: DC | PRN
  Administered 2012-10-31: 2 via ORAL
  Filled 2012-10-31: qty 2

## 2012-10-31 NOTE — Progress Notes (Signed)
Occupational Therapy Treatment Patient Details Name: [name] MRN: [id number] DOB: May 15, 1950 Today's Date: 10/31/2012 Time: 6962-9528 OT Time Calculation (min): 32 min  OT Assessment / Plan / Recommendation  History of present illness pt with PLIF L4-5, lumbar laminectomy   OT comments  Pt making excellent progress and should continue with acute OT services to maximize level of function and safety to return home   Follow Up Recommendations  Home health OT;Supervision/Assistance - 24 hour    Barriers to Discharge   None    Equipment Recommendations       Recommendations for Other Services    Frequency Min 2X/week   Progress towards OT Goals Progress towards OT goals: Progressing toward goals  Plan Discharge plan remains appropriate    Precautions / Restrictions Precautions Precautions: Back Precaution Comments: Pt able to recall 3/3 back precautions Restrictions Weight Bearing Restrictions: No   Pertinent Vitals/Pain 3/10 back    ADL  Grooming: Wash/dry hands;Wash/dry face;Min guard;Supervision/safety Where Assessed - Grooming: Supported standing Lower Body Bathing: Minimal assistance;Moderate assistance;Simulated Where Assessed - Lower Body Bathing: Supported sit to stand;Unsupported standing Lower Body Dressing: Moderate assistance Where Assessed - Lower Body Dressing: Unsupported sitting Toilet Transfer: Min guard;Performed Toilet Transfer Method: Sit to stand Toileting - Architect and Hygiene: Min guard Where Assessed - Engineer, mining and Hygiene: Standing Tub/Shower Transfer Method: Not assessed Equipment Used: Reacher;Rolling walker;Sock aid Transfers/Ambulation Related to ADLs: cues for bac precautions (bending)    OT Diagnosis:    OT Problem List:   OT Treatment Interventions:     OT Goals(current goals can now be found in the care plan section) Acute Rehab OT Goals Patient Stated Goal: " to return home "  Visit  Information  Last OT Received On: 10/31/12 Assistance Needed: +1 History of Present Illness: pt with PLIF L4-5, lumbar laminectomy    Subjective Data      Prior Functioning       Cognition  Cognition Arousal/Alertness: Awake/alert Behavior During Therapy: WFL for tasks assessed/performed Overall Cognitive Status: Within Functional Limits for tasks assessed    Mobility  Bed Mobility Bed Mobility: Not assessed Details for Bed Mobility Assistance: cues for technique and to maintain back precautions Transfers Sit to Stand: 4: Min guard;Without upper extremity assist;From chair/3-in-1;From toilet Stand to Sit: To chair/3-in-1;To toilet Details for Transfer Assistance: cues to maintain erect spine in transitions    Exercises      Balance     End of Session OT - End of Session Equipment Utilized During Treatment: Gait belt;Rolling walker;Other (comment) (3 in 1, ADL A/E) Activity Tolerance: Patient tolerated treatment well Patient left: in chair;with call bell/phone within reach  GO     Galen Manila 10/31/2012, 3:22 PM

## 2012-10-31 NOTE — Progress Notes (Signed)
Physical Therapy Treatment Patient Details Name: [name] MRN: [id number] DOB: [birth year] Today's Date: 10/31/2012 Time: 2440-1027 PT Time Calculation (min): 27 min  PT Assessment / Plan / Recommendation  History of Present Illness pt with PLIF L4-5, lumbar laminectomy   PT Comments   Pt. Less sedated today and making great progress with her mobility with PT.  She had no recall of back precautions and log rolling technique from yesterday but received further instruction today and she was able to verbalize 20 minutes later.    Follow Up Recommendations  Home health PT;Supervision/Assistance - 24 hour;Supervision for mobility/OOB     Does the patient have the potential to tolerate intense rehabilitation     Barriers to Discharge        Equipment Recommendations  Rolling walker with 5" wheels    Recommendations for Other Services    Frequency Min 6X/week   Progress towards PT Goals Progress towards PT goals: Progressing toward goals  Plan Current plan remains appropriate    Precautions / Restrictions Precautions Precautions: Back Precaution Comments: Pt. did not recall back precautions from yesterday.  She was re-instructed  on back precautions and log rolling and was able to recall them after 20 minutes.   Restrictions Weight Bearing Restrictions: No   Pertinent Vitals/Pain See vitals tab     Mobility  Bed Mobility Bed Mobility: Not assessed (pt. up in chair) Transfers Transfers: Sit to Stand;Stand to Sit Sit to Stand: 4: Min guard;Without upper extremity assist;From chair/3-in-1 Stand to Sit: 5: Supervision;With upper extremity assist;To bed Details for Transfer Assistance: cues to maintain erect spine in transitions Ambulation/Gait Ambulation/Gait Assistance: 4: Min guard Ambulation Distance (Feet): 300 Feet Assistive device: Rolling walker Ambulation/Gait Assistance Details: pt. needed several standing rest breaks during her walk but overall using good technique  and good use of RW Gait Pattern: Step-to pattern;Decreased step length - right;Decreased step length - left Gait velocity: decreased Stairs: Yes Stairs Assistance: 4: Min guard Stair Management Technique: Two rails;Step to pattern;Forwards Number of Stairs: 5 (x 2 reps)    Exercises     PT Diagnosis:    PT Problem List:   PT Treatment Interventions:     PT Goals (current goals can now be found in the care plan section)    Visit Information  Last PT Received On: 10/31/12 Assistance Needed: +1 History of Present Illness: pt with PLIF L4-5, lumbar laminectomy    Subjective Data  Subjective: Pt. reports she has "soreness, not pain"   Cognition  Cognition Arousal/Alertness: Awake/alert Behavior During Therapy: WFL for tasks assessed/performed Overall Cognitive Status: Within Functional Limits for tasks assessed    Balance     End of Session PT - End of Session Equipment Utilized During Treatment: Gait belt Activity Tolerance: Patient tolerated treatment well Patient left: in bed;with call bell/phone within reach;with family/visitor present Nurse Communication: Mobility status   GP     Ferman Hamming 10/31/2012, 8:51 AM Weldon Picking PT Acute Rehab Services [phone] [place] [phone]

## 2012-10-31 NOTE — Progress Notes (Signed)
Patient 2 days s/p 4/5 XLIF doing well. Pre-op left leg pain resolved. Patient denies LBP. R ant thigh discomfort in PT and with hip flexion consistent with psoas discomfort, not unexpected postop lateral interbody fusion. Pt pleased with progress, has had 1 session of PT/OT with slow steady gains.   BP 98/42  Pulse 72  Temp(Src) 100.4 F (38 C) (Oral)  Resp 16  Ht 5' 3.5" (1.613 m)  Wt 76.204 kg (168 lb)  BMI 29.29 kg/m2  SpO2 97%  Patient comfortable sitting up in chair, SCD's in place. Right lateral and posterior Dressings CDI, NVI, R psoas pain reproduced with hip flx.   POD #2 after 4/5 XLIF with posterior instrumentation, doing well  - cont PT/OT this AM  - d/c oxycontin - cont percocet and robaxin for pain control  -If pt continues to be sedated and pain well controlled may consider transition to norco - SCDs -Likely d/c tomorrow S/P PT/OT  -Written scripts in chart for home

## 2012-11-01 NOTE — Progress Notes (Signed)
PATIENT ID: [name]   3 Days Post-Op Procedure(s) (LRB): Lumbar 4-5 lateral interbody fusion with allograft and posterior spinal fusion with instrumentation./POSTERIOR LUMBAR FUSION 1 LEVEL (N/A)  Subjective: Doing well today. A little sore on operative side. Reports improved psoas discomfort. Ready to go home today.   Objective:  Filed Vitals:   11/01/12 0550  BP: 96/46  Pulse: 73  Temp: 99.5 F (37.5 C)  Resp: 18     Lying comfortably in bed SCDs in place Right lateral and posterior dressing c/d/i Distally NVI Minimal psoas discomfort with hip flx  Labs:  No results found for this basename: HGB,  in the last 72 hoursNo results found for this basename: WBC, RBC, HCT, PLT,  in the last 72 hoursNo results found for this basename: NA, K, CL, CO2, BUN, CREATININE, GLUCOSE, CALCIUM,  in the last 72 hours  Assessment and Plan: Day 3 s/p 4/5 XLIF with post instrumentation, ready to go home D/c home today after OT session, cleared by PT Continue percocet and robaxin for pain control, scripts in chart FU with Dr. Yevette Edwards, information in chart   VTE proph: SCDs

## 2012-11-01 NOTE — Progress Notes (Signed)
Physical Therapy Treatment Patient Details Name: NAZYIA GAUGH MRN: [id number] DOB: 21-Sep-1950 Today's Date: 11/01/2012 Time: 7829-5621 PT Time Calculation (min): 16 min  PT Assessment / Plan / Recommendation  History of Present Illness pt with PLIF L4-5, lumbar laminectomy   PT Comments   Patient progressing well with ambulation and stairs. Anticipate DC today  Follow Up Recommendations  Home health PT;Supervision/Assistance - 24 hour;Supervision for mobility/OOB     Does the patient have the potential to tolerate intense rehabilitation     Barriers to Discharge        Equipment Recommendations  Rolling walker with 5" wheels    Recommendations for Other Services    Frequency Min 6X/week   Progress towards PT Goals Progress towards PT goals: Progressing toward goals  Plan Current plan remains appropriate    Precautions / Restrictions Precautions Precautions: Back Precaution Comments: Pt able to recall 3/3 back precautions   Pertinent Vitals/Pain Complained of burning in leg. Notified RN for pain meds   Mobility  Bed Mobility Bed Mobility: Sit to Supine Rolling Left: 5: Supervision Left Sidelying to Sit: 4: Min assist;With rails Sitting - Scoot to Edge of Bed: 4: Min guard Sit to Supine: 5: Supervision Details for Bed Mobility Assistance: cues for technique and to maintain back precautions. A to come to upright sitting Transfers Sit to Stand: 5: Supervision;With upper extremity assist;From bed Stand to Sit: To bed;5: Supervision;With upper extremity assist Details for Transfer Assistance: cues to maintain erect spine in transitions Ambulation/Gait Ambulation/Gait Assistance: 5: Supervision Ambulation Distance (Feet): 300 Feet Gait Pattern: Step-through pattern;Decreased stride length Stairs: Yes Stairs Assistance: 5: Supervision Stair Management Technique: Two rails;Step to pattern;Forwards Number of Stairs: 5    Exercises     PT Diagnosis:    PT Problem List:    PT Treatment Interventions:     PT Goals (current goals can now be found in the care plan section)    Visit Information  Last PT Received On: 11/01/12 Assistance Needed: +1 History of Present Illness: pt with PLIF L4-5, lumbar laminectomy    Subjective Data      Cognition  Cognition Arousal/Alertness: Awake/alert Behavior During Therapy: WFL for tasks assessed/performed Overall Cognitive Status: Within Functional Limits for tasks assessed    Balance     End of Session PT - End of Session Equipment Utilized During Treatment: Gait belt Activity Tolerance: Patient tolerated treatment well Patient left: in bed;with call bell/phone within reach;with family/visitor present Nurse Communication: Mobility status   GP     Fredrich Birks 11/01/2012, 8:15 AM 11/01/2012 Fredrich Birks PTA [phone] pager [phone] office

## 2012-11-12 NOTE — Discharge Summary (Signed)
Patient ID: CABRIA MICALIZZI MRN: [id number] DOB/AGE: 1950/07/15 62 y.o.  Admit date: 10/29/2012 Discharge date: 11/01/2012  Admission Diagnoses: Radiculopathy  Discharge Diagnoses:  Same  Past Medical History  Diagnosis Date  . Hypertension   . GERD (gastroesophageal reflux disease)   . Headache(784.0)     last one was 'acouple' yrs  . Anxiety   . Depression     on meds for 'acouple of yrs now"  . Fibromyalgia     Surgeries: Procedure(s): Lumbar 4-5 lateral interbody fusion with allograft and posterior spinal fusion with instrumentation./POSTERIOR LUMBAR FUSION 1 LEVEL L4-5 on 10/29/2012   Discharged Condition: Improved  Hospital Course: AUSHA SIEH is an 62 y.o. female who was admitted 10/29/2012 for operative treatment of radiculopathy. Patient has severe unremitting pain that affects sleep, daily activities, and work/hobbies. After pre-op clearance the patient was taken to the operating room on 10/29/2012 and underwent  Procedure(s): Lumbar 4-5 lateral interbody fusion with allograft and posterior spinal fusion with instrumentation./POSTERIOR LUMBAR FUSION 1 LEVEL L4-5.    Patient was given perioperative antibiotics:  Anti-infectives   Start     Dose/Rate Route Frequency Ordered Stop   10/30/12 0000  vancomycin (VANCOCIN) IVPB 1000 mg/200 mL premix     1,000 mg 200 mL/hr over 60 Minutes Intravenous  Once 10/29/12 1837 10/30/12 0121   10/29/12 0600  ceFAZolin (ANCEF) IVPB 2 g/50 mL premix     2 g 100 mL/hr over 30 Minutes Intravenous On call to O.R. 10/28/12 1437 10/29/12 1200       Patient was given sequential compression devices, early ambulation to prevent DVT.  Patient benefited maximally from hospital stay and there were no complications.    Recent vital signs: BP 96/46  Pulse 73  Temp(Src) 99.5 F (37.5 C) (Oral)  Resp 18  Ht 5' 3.5" (1.613 m)  Wt 76.204 kg (168 lb)  BMI 29.29 kg/m2  SpO2 97%   Discharge Medications:     Medication List    STOP taking  these medications       HYDROcodone-acetaminophen 10-325 MG per tablet  Commonly known as:  NORCO      TAKE these medications       Calcium-Vitamin D3 600-500 MG-UNIT Caps  Take 1 capsule by mouth daily.     cetirizine 10 MG tablet  Commonly known as:  ZYRTEC  Take 10 mg by mouth 2 (two) times daily.     citalopram 20 MG tablet  Commonly known as:  CELEXA  Take 20 mg by mouth daily.     FISH OIL PO  Take 1,290 mg by mouth 2 (two) times daily.     lisinopril 20 MG tablet  Commonly known as:  PRINIVIL,ZESTRIL  Take 20 mg by mouth at bedtime.     magnesium oxide 400 MG tablet  Commonly known as:  MAG-OX  Take 400 mg by mouth daily.     metoprolol 50 MG tablet  Commonly known as:  LOPRESSOR  Take 50 mg by mouth 2 (two) times daily.     multivitamin with minerals Tabs tablet  Take 1 tablet by mouth daily. Women's over 50     omeprazole 20 MG tablet  Commonly known as:  PRILOSEC OTC  Take 20 mg by mouth daily.     OVER THE COUNTER MEDICATION  Take 1 tablet by mouth at bedtime. Protandim (dietary supplement)     potassium gluconate 595 MG Tabs tablet  Take 595 mg by mouth daily.  pregabalin 75 MG capsule  Commonly known as:  LYRICA  Take 75 mg by mouth at bedtime.     simvastatin 20 MG tablet  Commonly known as:  ZOCOR  Take 20 mg by mouth at bedtime.        Diagnostic Studies: Dg Chest 2 View  10/24/2012   *RADIOLOGY REPORT*  Clinical Data: Preoperative lumbar surgery; hypertension  CHEST - 2 VIEW  Comparison: None.  Findings: Lungs are clear.  Heart size and pulmonary vascularity are normal.  No adenopathy.  No bone lesions.  IMPRESSION: No edema or consolidation.   Original Report Authenticated By: Bretta Bang, M.D.   Dg Lumbar Spine 2-3 Views  10/29/2012   *RADIOLOGY REPORT*  Clinical Data: Back surgery, L4-5 unilateral fusion  LUMBAR SPINE - 2-3 VIEW  Comparison: 10/29/2012  Findings: Right unipedicular posterior fusion with a disc spacer at L4-5.  Alignment appears anatomic.  No acute osseous or hardware abnormality.  Nonobstructive bowel gas pattern.  IMPRESSION: Status post L4-5 unilateral fusion on the right.  Anatomic alignment.   Original Report Authenticated By: Judie Petit. Shick, M.D.   Dg Lumbar Spine 2-3 Views  10/29/2012   *RADIOLOGY REPORT*  Clinical Data: L4-L5 XLIF  DG C-ARM GT 120 MIN,LUMBAR SPINE - 2-3 VIEW  Technique: Anterior posterior spot radiographs obtained intraoperatively  Comparison:  MRI lumbar spine 04/17/2012  Findings: Frontal and lateral spot films demonstrate right L4 and L5 pedicle screws with a single rod and interbody spacer consistent with extreme lateral internal fixation from a right approach.  No evidence of immediate hardware complication.  Minimal anterolisthesis of L4-L5 similar to the preoperative MRI.  IMPRESSION: Right approach XLIF of L4-L5 as above.   Original Report Authenticated By: Malachy Moan, M.D.   Dg C-arm Gt 120 Min  10/29/2012   *RADIOLOGY REPORT*  Clinical Data: L4-L5 XLIF  DG C-ARM GT 120 MIN,LUMBAR SPINE - 2-3 VIEW  Technique: Anterior posterior spot radiographs obtained intraoperatively  Comparison:  MRI lumbar spine 04/17/2012  Findings: Frontal and lateral spot films demonstrate right L4 and L5 pedicle screws with a single rod and interbody spacer consistent with extreme lateral internal fixation from a right approach.  No evidence of immediate hardware complication.  Minimal anterolisthesis of L4-L5 similar to the preoperative MRI.  IMPRESSION: Right approach XLIF of L4-L5 as above.   Original Report Authenticated By: Malachy Moan, M.D.    Disposition: 01-Home or Self Care      Discharge Orders   Future Orders Complete By Expires   Call MD / Call 911  As directed    Comments:     If you experience chest pain or shortness of breath, CALL 911 and be transported to the hospital emergency room.  If you develope a fever above 101 F, pus (white drainage) or increased drainage or redness at  the wound, or calf pain, call your surgeon's office.   Constipation Prevention  As directed    Comments:     Drink plenty of fluids.  Prune juice may be helpful.  You may use a stool softener, such as Colace (over the counter) 100 mg twice a day.  Use MiraLax (over the counter) for constipation as needed.   Diet - low sodium heart healthy  As directed    Increase activity slowly as tolerated  As directed       Assessment and Plan:  Day 3 s/p 4/5 XLIF with post instrumentation, doing well D/c home today after OT session, cleared by PT  Continue  percocet and robaxin for pain control, scripts in chart  FU with Dr. Yevette Edwards, information in chart   Signed: Georga Bora 11/12/2012, 12:28 PM

## 2012-12-12 DIAGNOSIS — M545 Low back pain: Secondary | ICD-10-CM | POA: Diagnosis not present

## 2012-12-12 DIAGNOSIS — Q762 Congenital spondylolisthesis: Secondary | ICD-10-CM | POA: Diagnosis not present

## 2013-01-09 DIAGNOSIS — IMO0002 Reserved for concepts with insufficient information to code with codable children: Secondary | ICD-10-CM | POA: Diagnosis not present

## 2013-01-28 DIAGNOSIS — I10 Essential (primary) hypertension: Secondary | ICD-10-CM | POA: Diagnosis not present

## 2013-01-28 DIAGNOSIS — R609 Edema, unspecified: Secondary | ICD-10-CM | POA: Diagnosis not present

## 2013-01-28 DIAGNOSIS — E785 Hyperlipidemia, unspecified: Secondary | ICD-10-CM | POA: Diagnosis not present

## 2013-01-28 DIAGNOSIS — IMO0001 Reserved for inherently not codable concepts without codable children: Secondary | ICD-10-CM | POA: Diagnosis not present

## 2013-02-06 DIAGNOSIS — M5412 Radiculopathy, cervical region: Secondary | ICD-10-CM | POA: Diagnosis not present

## 2013-02-06 DIAGNOSIS — M545 Low back pain: Secondary | ICD-10-CM | POA: Diagnosis not present

## 2013-02-25 ENCOUNTER — Other Ambulatory Visit: Payer: Self-pay | Admitting: Orthopedic Surgery

## 2013-02-25 DIAGNOSIS — M810 Age-related osteoporosis without current pathological fracture: Secondary | ICD-10-CM | POA: Diagnosis not present

## 2013-02-25 DIAGNOSIS — M899 Disorder of bone, unspecified: Secondary | ICD-10-CM | POA: Diagnosis not present

## 2013-02-25 DIAGNOSIS — Z13820 Encounter for screening for osteoporosis: Secondary | ICD-10-CM | POA: Diagnosis not present

## 2013-02-25 DIAGNOSIS — Z1231 Encounter for screening mammogram for malignant neoplasm of breast: Secondary | ICD-10-CM | POA: Diagnosis not present

## 2013-04-01 ENCOUNTER — Encounter (HOSPITAL_COMMUNITY)
Admission: RE | Admit: 2013-04-01 | Discharge: 2013-04-01 | Disposition: A | Discharge status: Home / Self Care | Payer: Medicare Other | Source: Ambulatory Visit | Attending: Orthopedic Surgery | Admitting: Orthopedic Surgery

## 2013-04-01 ENCOUNTER — Encounter (HOSPITAL_COMMUNITY): Payer: Self-pay

## 2013-04-01 DIAGNOSIS — Z01818 Encounter for other preprocedural examination: Secondary | ICD-10-CM | POA: Diagnosis not present

## 2013-04-01 DIAGNOSIS — Z01812 Encounter for preprocedural laboratory examination: Secondary | ICD-10-CM | POA: Insufficient documentation

## 2013-04-01 LAB — URINALYSIS, ROUTINE W REFLEX MICROSCOPIC
Bilirubin Urine: NEGATIVE
Glucose, UA: NEGATIVE mg/dL
Ketones, ur: NEGATIVE mg/dL
Leukocytes, UA: NEGATIVE
NITRITE: NEGATIVE
PROTEIN: 100 mg/dL — AB
Specific Gravity, Urine: 1.022 (ref 1.005–1.030)
UROBILINOGEN UA: 0.2 mg/dL (ref 0.0–1.0)
pH: 6 (ref 5.0–8.0)

## 2013-04-01 LAB — URINE MICROSCOPIC-ADD ON

## 2013-04-01 LAB — CBC WITH DIFFERENTIAL/PLATELET
BASOS PCT: 1 % (ref 0–1)
Basophils Absolute: 0 10*3/uL (ref 0.0–0.1)
EOS PCT: 5 % (ref 0–5)
Eosinophils Absolute: 0.3 10*3/uL (ref 0.0–0.7)
HCT: 31.6 % — ABNORMAL LOW (ref 36.0–46.0)
Hemoglobin: 10.2 g/dL — ABNORMAL LOW (ref 12.0–15.0)
Lymphocytes Relative: 40 % (ref 12–46)
Lymphs Abs: 2.7 10*3/uL (ref 0.7–4.0)
MCH: 28 pg (ref 26.0–34.0)
MCHC: 32.3 g/dL (ref 30.0–36.0)
MCV: 86.8 fL (ref 78.0–100.0)
Monocytes Absolute: 0.6 10*3/uL (ref 0.1–1.0)
Monocytes Relative: 9 % (ref 3–12)
NEUTROS PCT: 46 % (ref 43–77)
Neutro Abs: 3.1 10*3/uL (ref 1.7–7.7)
PLATELETS: 276 10*3/uL (ref 150–400)
RBC: 3.64 MIL/uL — AB (ref 3.87–5.11)
RDW: 14.1 % (ref 11.5–15.5)
WBC: 6.7 10*3/uL (ref 4.0–10.5)

## 2013-04-01 LAB — TYPE AND SCREEN
ABO/RH(D): A POS
ANTIBODY SCREEN: NEGATIVE

## 2013-04-01 LAB — COMPREHENSIVE METABOLIC PANEL
ALBUMIN: 3.5 g/dL (ref 3.5–5.2)
ALK PHOS: 90 U/L (ref 39–117)
ALT: 7 U/L (ref 0–35)
AST: 14 U/L (ref 0–37)
BUN: 9 mg/dL (ref 6–23)
CO2: 34 mEq/L — ABNORMAL HIGH (ref 19–32)
Calcium: 8.8 mg/dL (ref 8.4–10.5)
Chloride: 102 mEq/L (ref 96–112)
Creatinine, Ser: 0.94 mg/dL (ref 0.50–1.10)
GFR calc non Af Amer: 64 mL/min — ABNORMAL LOW (ref >90)
GFR, EST AFRICAN AMERICAN: 74 mL/min — AB (ref >90)
Glucose, Bld: 83 mg/dL (ref 70–99)
POTASSIUM: 3.8 meq/L (ref 3.7–5.3)
SODIUM: 145 meq/L (ref 137–147)
TOTAL PROTEIN: 7.1 g/dL (ref 6.0–8.3)
Total Bilirubin: 0.2 mg/dL — ABNORMAL LOW (ref 0.3–1.2)

## 2013-04-01 LAB — SURGICAL PCR SCREEN
MRSA, PCR: NEGATIVE
Staphylococcus aureus: POSITIVE — AB

## 2013-04-01 LAB — PROTIME-INR
INR: 0.97 (ref 0.00–1.49)
Prothrombin Time: 12.7 seconds (ref 11.6–15.2)

## 2013-04-01 LAB — APTT: aPTT: 29 seconds (ref 24–37)

## 2013-04-01 NOTE — Pre-Procedure Instructions (Signed)
[name]  04/01/2013   Your procedure is scheduled on:  04/09/13  Report to [facility]  2 * 3 at 530 AM.  Call this number if you have problems the morning of surgery: [phone]   Remember:   Do not eat food or drink liquids after midnight.   Take these medicines the morning of surgery with A SIP OF WATER: zyrect,celexa,metoprolol,prilosec   Do not wear jewelry, make-up or nail polish.  Do not wear lotions, powders, or perfumes. You may wear deodorant.  Do not shave 48 hours prior to surgery. Men may shave face and neck.  Do not bring valuables to the hospital.  [facility] is not responsible                  for any belongings or valuables.               Contacts, dentures or bridgework may not be worn into surgery.  Leave suitcase in the car. After surgery it may be brought to your room.  For patients admitted to the hospital, discharge time is determined by your                treatment team.               Patients discharged the day of surgery will not be allowed to drive  home.  Name and phone number of your driver:   Special Instructions: Shower using CHG 2 nights before surgery and the night before surgery.  If you shower the day of surgery use CHG.  Use special wash - you have one bottle of CHG for all showers.  You should use approximately 1/3 of the bottle for each shower.   Please read over the following fact sheets that you were given: Pain Booklet, Coughing and Deep Breathing, Blood Transfusion Information, MRSA Information and Surgical Site Infection Prevention

## 2013-04-08 MED ORDER — VANCOMYCIN HCL IN DEXTROSE 1-5 GM/200ML-% IV SOLN
1000.0000 mg | INTRAVENOUS | Status: AC
Start: 2013-04-09 — End: 2013-04-09
  Administered 2013-04-09: 1000 mg via INTRAVENOUS
  Filled 2013-04-08: qty 200

## 2013-04-09 ENCOUNTER — Encounter (HOSPITAL_COMMUNITY)
Admission: RE | Disposition: A | Discharge status: Home / Self Care | Payer: Self-pay | Source: Ambulatory Visit | Attending: Orthopedic Surgery

## 2013-04-09 ENCOUNTER — Inpatient Hospital Stay (HOSPITAL_COMMUNITY): Payer: Medicare Other | Admitting: Anesthesiology

## 2013-04-09 ENCOUNTER — Inpatient Hospital Stay (HOSPITAL_COMMUNITY): Payer: Medicare Other

## 2013-04-09 ENCOUNTER — Inpatient Hospital Stay (HOSPITAL_COMMUNITY)
Admission: RE | Admit: 2013-04-09 | Discharge: 2013-04-10 | DRG: 472 | Disposition: A | Discharge status: Home / Self Care | Payer: Medicare Other | Source: Ambulatory Visit | Attending: Orthopedic Surgery | Admitting: Orthopedic Surgery

## 2013-04-09 ENCOUNTER — Encounter (HOSPITAL_COMMUNITY): Payer: Medicare Other | Admitting: Anesthesiology

## 2013-04-09 ENCOUNTER — Encounter (HOSPITAL_COMMUNITY): Payer: Self-pay | Admitting: Anesthesiology

## 2013-04-09 DIAGNOSIS — D649 Anemia, unspecified: Secondary | ICD-10-CM | POA: Diagnosis present

## 2013-04-09 DIAGNOSIS — F3289 Other specified depressive episodes: Secondary | ICD-10-CM | POA: Diagnosis present

## 2013-04-09 DIAGNOSIS — IMO0001 Reserved for inherently not codable concepts without codable children: Secondary | ICD-10-CM | POA: Diagnosis not present

## 2013-04-09 DIAGNOSIS — Z9109 Other allergy status, other than to drugs and biological substances: Secondary | ICD-10-CM

## 2013-04-09 DIAGNOSIS — Z889 Allergy status to unspecified drugs, medicaments and biological substances status: Secondary | ICD-10-CM | POA: Diagnosis not present

## 2013-04-09 DIAGNOSIS — K219 Gastro-esophageal reflux disease without esophagitis: Secondary | ICD-10-CM | POA: Diagnosis not present

## 2013-04-09 DIAGNOSIS — I10 Essential (primary) hypertension: Secondary | ICD-10-CM | POA: Diagnosis present

## 2013-04-09 DIAGNOSIS — F411 Generalized anxiety disorder: Secondary | ICD-10-CM | POA: Diagnosis present

## 2013-04-09 DIAGNOSIS — G959 Disease of spinal cord, unspecified: Secondary | ICD-10-CM | POA: Diagnosis not present

## 2013-04-09 DIAGNOSIS — M4712 Other spondylosis with myelopathy, cervical region: Secondary | ICD-10-CM | POA: Diagnosis not present

## 2013-04-09 DIAGNOSIS — Z880 Allergy status to penicillin: Secondary | ICD-10-CM

## 2013-04-09 DIAGNOSIS — Z79899 Other long term (current) drug therapy: Secondary | ICD-10-CM | POA: Diagnosis not present

## 2013-04-09 DIAGNOSIS — Z87891 Personal history of nicotine dependence: Secondary | ICD-10-CM | POA: Diagnosis not present

## 2013-04-09 DIAGNOSIS — M5410 Radiculopathy, site unspecified: Secondary | ICD-10-CM | POA: Diagnosis present

## 2013-04-09 DIAGNOSIS — M5090 Cervical disc disorder, unspecified, unspecified cervical region: Secondary | ICD-10-CM | POA: Diagnosis not present

## 2013-04-09 DIAGNOSIS — M79609 Pain in unspecified limb: Secondary | ICD-10-CM | POA: Diagnosis not present

## 2013-04-09 DIAGNOSIS — F329 Major depressive disorder, single episode, unspecified: Secondary | ICD-10-CM | POA: Diagnosis present

## 2013-04-09 HISTORY — PX: ANTERIOR CERVICAL DECOMP/DISCECTOMY FUSION: SHX1161

## 2013-04-09 SURGERY — ANTERIOR CERVICAL DECOMPRESSION/DISCECTOMY FUSION 3 LEVELS
Anesthesia: General | Site: Spine Cervical

## 2013-04-09 MED ORDER — OXYCODONE HCL 5 MG PO TABS: 5.0000 mg | ORAL_TABLET | Freq: Once | ORAL | Status: DC | PRN

## 2013-04-09 MED ORDER — BUPIVACAINE-EPINEPHRINE 0.25% -1:200000 IJ SOLN
INTRAMUSCULAR | Status: DC | PRN
  Administered 2013-04-09: 4 mL

## 2013-04-09 MED ORDER — MAGNESIUM OXIDE 400 (241.3 MG) MG PO TABS
400.0000 mg | ORAL_TABLET | Freq: Every day | ORAL | Status: DC
  Filled 2013-04-09: qty 1

## 2013-04-09 MED ORDER — HYDROMORPHONE HCL PF 1 MG/ML IJ SOLN: 0.2500 mg | INTRAMUSCULAR | Status: DC | PRN

## 2013-04-09 MED ORDER — ALUM & MAG HYDROXIDE-SIMETH 200-200-20 MG/5ML PO SUSP: 30.0000 mL | Freq: Four times a day (QID) | ORAL | Status: DC | PRN

## 2013-04-09 MED ORDER — SODIUM CHLORIDE 0.9 % IJ SOLN: 3.0000 mL | INTRAMUSCULAR | Status: DC | PRN

## 2013-04-09 MED ORDER — THROMBIN 20000 UNITS EX SOLR
CUTANEOUS | Status: AC
  Filled 2013-04-09: qty 20000

## 2013-04-09 MED ORDER — PREGABALIN 50 MG PO CAPS
75.0000 mg | ORAL_CAPSULE | Freq: Every day | ORAL | Status: DC
  Administered 2013-04-09: 21:00:00 75 mg via ORAL
  Filled 2013-04-09 (×2): qty 1

## 2013-04-09 MED ORDER — PHENYLEPHRINE HCL 10 MG/ML IJ SOLN
INTRAMUSCULAR | Status: DC | PRN
  Administered 2013-04-09: 120 ug via INTRAVENOUS

## 2013-04-09 MED ORDER — ACETAMINOPHEN 325 MG PO TABS
650.0000 mg | ORAL_TABLET | ORAL | Status: DC | PRN
  Administered 2013-04-09 – 2013-04-10 (×2): 650 mg via ORAL
  Filled 2013-04-09 (×2): qty 2

## 2013-04-09 MED ORDER — ONDANSETRON HCL 4 MG/2ML IJ SOLN: 4.0000 mg | INTRAMUSCULAR | Status: DC | PRN

## 2013-04-09 MED ORDER — ACETAMINOPHEN 650 MG RE SUPP: 650.0000 mg | RECTAL | Status: DC | PRN

## 2013-04-09 MED ORDER — BUPIVACAINE-EPINEPHRINE (PF) 0.25% -1:200000 IJ SOLN
INTRAMUSCULAR | Status: AC
  Filled 2013-04-09: qty 30

## 2013-04-09 MED ORDER — ROCURONIUM BROMIDE 100 MG/10ML IV SOLN
INTRAVENOUS | Status: DC | PRN
  Administered 2013-04-09: 50 mg via INTRAVENOUS

## 2013-04-09 MED ORDER — NEOSTIGMINE METHYLSULFATE 1 MG/ML IJ SOLN
INTRAMUSCULAR | Status: DC | PRN
  Administered 2013-04-09: 3 mg via INTRAVENOUS

## 2013-04-09 MED ORDER — OMEPRAZOLE MAGNESIUM 20 MG PO TBEC: 20.0000 mg | DELAYED_RELEASE_TABLET | Freq: Every day | ORAL | Status: DC

## 2013-04-09 MED ORDER — LIDOCAINE HCL (CARDIAC) 20 MG/ML IV SOLN
INTRAVENOUS | Status: DC | PRN
  Administered 2013-04-09: 100 mg via INTRAVENOUS

## 2013-04-09 MED ORDER — SODIUM CHLORIDE 0.9 % IJ SOLN
3.0000 mL | Freq: Two times a day (BID) | INTRAMUSCULAR | Status: DC
  Administered 2013-04-09: 3 mL via INTRAVENOUS

## 2013-04-09 MED ORDER — MAGNESIUM OXIDE 400 MG PO TABS: 400.0000 mg | ORAL_TABLET | Freq: Every day | ORAL | Status: DC

## 2013-04-09 MED ORDER — GLYCOPYRROLATE 0.2 MG/ML IJ SOLN
INTRAMUSCULAR | Status: DC | PRN
  Administered 2013-04-09: 0.2 mg via INTRAVENOUS
  Administered 2013-04-09: 0.1 mg via INTRAVENOUS
  Administered 2013-04-09: 0.6 mg via INTRAVENOUS

## 2013-04-09 MED ORDER — DOCUSATE SODIUM 100 MG PO CAPS
100.0000 mg | ORAL_CAPSULE | Freq: Two times a day (BID) | ORAL | Status: DC
  Administered 2013-04-09: 100 mg via ORAL
  Filled 2013-04-09 (×3): qty 1

## 2013-04-09 MED ORDER — MORPHINE SULFATE 2 MG/ML IJ SOLN
1.0000 mg | INTRAMUSCULAR | Status: DC | PRN
  Administered 2013-04-09: 2 mg via INTRAVENOUS
  Filled 2013-04-09: qty 1

## 2013-04-09 MED ORDER — POTASSIUM GLUCONATE 595 (99 K) MG PO TABS: 595.0000 mg | ORAL_TABLET | Freq: Every day | ORAL | Status: DC

## 2013-04-09 MED ORDER — SIMVASTATIN 20 MG PO TABS
20.0000 mg | ORAL_TABLET | Freq: Every day | ORAL | Status: DC
  Administered 2013-04-09: 20 mg via ORAL
  Filled 2013-04-09 (×2): qty 1

## 2013-04-09 MED ORDER — THROMBIN 20000 UNITS EX SOLR
CUTANEOUS | Status: DC | PRN
  Administered 2013-04-09: 10:00:00

## 2013-04-09 MED ORDER — PANTOPRAZOLE SODIUM 40 MG PO TBEC: 40.0000 mg | DELAYED_RELEASE_TABLET | Freq: Every day | ORAL | Status: DC

## 2013-04-09 MED ORDER — LORATADINE 10 MG PO TABS
10.0000 mg | ORAL_TABLET | Freq: Every day | ORAL | Status: DC
  Filled 2013-04-09: qty 1

## 2013-04-09 MED ORDER — SENNOSIDES-DOCUSATE SODIUM 8.6-50 MG PO TABS
1.0000 | ORAL_TABLET | Freq: Every evening | ORAL | Status: DC | PRN
  Filled 2013-04-09: qty 1

## 2013-04-09 MED ORDER — FENTANYL CITRATE 0.05 MG/ML IJ SOLN
INTRAMUSCULAR | Status: DC | PRN
  Administered 2013-04-09: 50 ug via INTRAVENOUS
  Administered 2013-04-09: 150 ug via INTRAVENOUS
  Administered 2013-04-09 (×3): 50 ug via INTRAVENOUS

## 2013-04-09 MED ORDER — CALCIUM CARBONATE 600 MG PO TABS
600.0000 mg | ORAL_TABLET | Freq: Every day | ORAL | Status: DC
  Filled 2013-04-09: qty 1

## 2013-04-09 MED ORDER — ONDANSETRON HCL 4 MG/2ML IJ SOLN: 4.0000 mg | Freq: Once | INTRAMUSCULAR | Status: DC | PRN

## 2013-04-09 MED ORDER — SODIUM CHLORIDE 0.9 % IV SOLN: 250.0000 mL | INTRAVENOUS | Status: DC

## 2013-04-09 MED ORDER — ONDANSETRON HCL 4 MG/2ML IJ SOLN
INTRAMUSCULAR | Status: DC | PRN
  Administered 2013-04-09: 4 mg via INTRAVENOUS

## 2013-04-09 MED ORDER — EPHEDRINE SULFATE 50 MG/ML IJ SOLN
INTRAMUSCULAR | Status: DC | PRN
  Administered 2013-04-09: 15 mg via INTRAVENOUS

## 2013-04-09 MED ORDER — 0.9 % SODIUM CHLORIDE (POUR BTL) OPTIME
TOPICAL | Status: DC | PRN
  Administered 2013-04-09: 1000 mL

## 2013-04-09 MED ORDER — OXYCODONE-ACETAMINOPHEN 5-325 MG PO TABS
1.0000 | ORAL_TABLET | ORAL | Status: DC | PRN
  Administered 2013-04-09: 2 via ORAL
  Filled 2013-04-09: qty 2

## 2013-04-09 MED ORDER — MENTHOL 3 MG MT LOZG
1.0000 | LOZENGE | OROMUCOSAL | Status: DC | PRN
  Administered 2013-04-09: 3 mg via ORAL
  Filled 2013-04-09: qty 9

## 2013-04-09 MED ORDER — PHENOL 1.4 % MT LIQD: 1.0000 | OROMUCOSAL | Status: DC | PRN

## 2013-04-09 MED ORDER — LISINOPRIL 20 MG PO TABS
20.0000 mg | ORAL_TABLET | Freq: Every day | ORAL | Status: DC
  Administered 2013-04-09: 20 mg via ORAL
  Filled 2013-04-09 (×2): qty 1

## 2013-04-09 MED ORDER — LACTATED RINGERS IV SOLN
INTRAVENOUS | Status: DC | PRN
  Administered 2013-04-09 (×2): via INTRAVENOUS

## 2013-04-09 MED ORDER — BISACODYL 5 MG PO TBEC
5.0000 mg | DELAYED_RELEASE_TABLET | Freq: Every day | ORAL | Status: DC | PRN
  Filled 2013-04-09: qty 1

## 2013-04-09 MED ORDER — FLEET ENEMA 7-19 GM/118ML RE ENEM
1.0000 | ENEMA | Freq: Once | RECTAL | Status: AC | PRN
  Filled 2013-04-09: qty 1

## 2013-04-09 MED ORDER — PROPOFOL 10 MG/ML IV BOLUS
INTRAVENOUS | Status: DC | PRN
  Administered 2013-04-09: 150 mg via INTRAVENOUS

## 2013-04-09 MED ORDER — DIAZEPAM 5 MG PO TABS
5.0000 mg | ORAL_TABLET | Freq: Four times a day (QID) | ORAL | Status: DC | PRN
  Administered 2013-04-09: 5 mg via ORAL
  Filled 2013-04-09: qty 1

## 2013-04-09 MED ORDER — METOPROLOL TARTRATE 50 MG PO TABS
50.0000 mg | ORAL_TABLET | Freq: Two times a day (BID) | ORAL | Status: DC
  Administered 2013-04-09: 50 mg via ORAL
  Filled 2013-04-09 (×3): qty 1

## 2013-04-09 MED ORDER — OXYCODONE HCL 5 MG/5ML PO SOLN: 5.0000 mg | Freq: Once | ORAL | Status: DC | PRN

## 2013-04-09 MED ORDER — CITALOPRAM HYDROBROMIDE 20 MG PO TABS
20.0000 mg | ORAL_TABLET | Freq: Every day | ORAL | Status: DC
  Filled 2013-04-09: qty 1

## 2013-04-09 MED ORDER — VANCOMYCIN HCL IN DEXTROSE 1-5 GM/200ML-% IV SOLN
1000.0000 mg | Freq: Once | INTRAVENOUS | Status: AC
  Administered 2013-04-09: 1000 mg via INTRAVENOUS
  Filled 2013-04-09: qty 200

## 2013-04-09 MED ORDER — CALCIUM CARBONATE 1250 (500 CA) MG PO TABS
1.0000 | ORAL_TABLET | Freq: Every day | ORAL | Status: DC
  Filled 2013-04-09 (×2): qty 1

## 2013-04-09 MED ORDER — POVIDONE-IODINE 7.5 % EX SOLN
Freq: Once | CUTANEOUS | Status: DC
  Filled 2013-04-09: qty 118

## 2013-04-09 MED ORDER — ZOLPIDEM TARTRATE 5 MG PO TABS
5.0000 mg | ORAL_TABLET | Freq: Every evening | ORAL | Status: DC | PRN
Start: 2013-04-09 — End: 2013-04-10

## 2013-04-09 SURGICAL SUPPLY — 77 items
APL SKNCLS STERI-STRIP NONHPOA (GAUZE/BANDAGES/DRESSINGS) ×1
BENZOIN TINCTURE PRP APPL 2/3 (GAUZE/BANDAGES/DRESSINGS) ×3 IMPLANT
BIT DRILL NEURO 2X3.1 SFT TUCH (MISCELLANEOUS) ×1 IMPLANT
BIT DRILL SRG 14X2.2XFLT CHK (BIT) ×1 IMPLANT
BIT DRL SRG 14X2.2XFLT CHK (BIT) ×1
BLADE SURG 15 STRL LF DISP TIS (BLADE) ×1 IMPLANT
BLADE SURG 15 STRL SS (BLADE) ×3
BLADE SURG ROTATE 9660 (MISCELLANEOUS) ×3 IMPLANT
BUR MATCHSTICK NEURO 3.0 LAGG (BURR) ×3 IMPLANT
CARTRIDGE OIL MAESTRO DRILL (MISCELLANEOUS) ×1 IMPLANT
CLOSURE STERI-STRIP 1/2X4 (GAUZE/BANDAGES/DRESSINGS) ×1
CLOSURE WOUND 1/2 X4 (GAUZE/BANDAGES/DRESSINGS) ×1
CLOTH BEACON ORANGE TIMEOUT ST (SAFETY) ×3 IMPLANT
CLSR STERI-STRIP ANTIMIC 1/2X4 (GAUZE/BANDAGES/DRESSINGS) ×2 IMPLANT
CORDS BIPOLAR (ELECTRODE) ×3 IMPLANT
COVER SURGICAL LIGHT HANDLE (MISCELLANEOUS) ×3 IMPLANT
CRADLE DONUT ADULT HEAD (MISCELLANEOUS) ×3 IMPLANT
DEVICE ENDSKLTN IMPLNT MED 5MM (Endomechanicals) ×2 IMPLANT
DIFFUSER DRILL AIR PNEUMATIC (MISCELLANEOUS) ×3 IMPLANT
DRAIN JACKSON RD 7FR 3/32 (WOUND CARE) IMPLANT
DRAPE C-ARM 42X72 X-RAY (DRAPES) ×3 IMPLANT
DRAPE POUCH INSTRU U-SHP 10X18 (DRAPES) ×3 IMPLANT
DRAPE SURG 17X23 STRL (DRAPES) ×9 IMPLANT
DRILL BIT SKYLINE 14MM (BIT) ×3
DRILL NEURO 2X3.1 SOFT TOUCH (MISCELLANEOUS) ×3
DURAPREP 26ML APPLICATOR (WOUND CARE) ×3 IMPLANT
ELECT COATED BLADE 2.86 ST (ELECTRODE) ×3 IMPLANT
ELECT REM PT RETURN 9FT ADLT (ELECTROSURGICAL) ×3
ELECTRODE REM PT RTRN 9FT ADLT (ELECTROSURGICAL) ×1 IMPLANT
ENDOSKELETON IMPLANT MED 5MM (Endomechanicals) ×6 IMPLANT
ENDOSKELETON IMPLANT MED 6M-0 (Orthopedic Implant) ×3 IMPLANT
EVACUATOR SILICONE 100CC (DRAIN) IMPLANT
GAUZE SPONGE 4X4 16PLY XRAY LF (GAUZE/BANDAGES/DRESSINGS) ×3 IMPLANT
GLOVE BIO SURGEON STRL SZ7 (GLOVE) ×3 IMPLANT
GLOVE BIO SURGEON STRL SZ8 (GLOVE) ×3 IMPLANT
GLOVE BIOGEL PI IND STRL 7.0 (GLOVE) ×2 IMPLANT
GLOVE BIOGEL PI IND STRL 8 (GLOVE) ×1 IMPLANT
GLOVE BIOGEL PI INDICATOR 7.0 (GLOVE) ×4
GLOVE BIOGEL PI INDICATOR 8 (GLOVE) ×2
GOWN STRL NON-REIN LRG LVL3 (GOWN DISPOSABLE) ×3 IMPLANT
GOWN STRL REIN XL XLG (GOWN DISPOSABLE) ×3 IMPLANT
IV CATH 14GX2 1/4 (CATHETERS) ×3 IMPLANT
KIT BASIN OR (CUSTOM PROCEDURE TRAY) ×3 IMPLANT
KIT ROOM TURNOVER OR (KITS) ×3 IMPLANT
MANIFOLD NEPTUNE II (INSTRUMENTS) ×3 IMPLANT
NEEDLE 27GAX1X1/2 (NEEDLE) ×3 IMPLANT
NEEDLE SPNL 20GX3.5 QUINCKE YW (NEEDLE) ×3 IMPLANT
NS IRRIG 1000ML POUR BTL (IV SOLUTION) ×3 IMPLANT
OIL CARTRIDGE MAESTRO DRILL (MISCELLANEOUS) ×3
PACK ORTHO CERVICAL (CUSTOM PROCEDURE TRAY) ×3 IMPLANT
PAD ARMBOARD 7.5X6 YLW CONV (MISCELLANEOUS) ×6 IMPLANT
PATTIES SURGICAL .5 X.5 (GAUZE/BANDAGES/DRESSINGS) ×3 IMPLANT
PATTIES SURGICAL .5 X1 (DISPOSABLE) ×3 IMPLANT
PIN DISTRACTION 14 (PIN) ×6 IMPLANT
PLATE SKYLINE 3LVL 42MM (Plate) ×3 IMPLANT
PUTTY BONE DBX 5CC MIX (Putty) ×3 IMPLANT
SCREW VAR SELF TAP SKYLINE 14M (Screw) ×24 IMPLANT
SPONGE GAUZE 4X4 12PLY (GAUZE/BANDAGES/DRESSINGS) ×3 IMPLANT
SPONGE GAUZE 4X4 12PLY STER LF (GAUZE/BANDAGES/DRESSINGS) ×3 IMPLANT
SPONGE INTESTINAL PEANUT (DISPOSABLE) ×3 IMPLANT
SPONGE SURGIFOAM ABS GEL 100 (HEMOSTASIS) ×3 IMPLANT
STRIP CLOSURE SKIN 1/2X4 (GAUZE/BANDAGES/DRESSINGS) ×2 IMPLANT
SURGIFLO TRUKIT (HEMOSTASIS) IMPLANT
SUT MNCRL AB 4-0 PS2 18 (SUTURE) ×3 IMPLANT
SUT SILK 4 0 (SUTURE)
SUT SILK 4-0 18XBRD TIE 12 (SUTURE) IMPLANT
SUT VIC AB 2-0 CT2 18 VCP726D (SUTURE) ×3 IMPLANT
SYR BULB IRRIGATION 50ML (SYRINGE) ×3 IMPLANT
SYR CONTROL 10ML LL (SYRINGE) ×3 IMPLANT
TAPE CLOTH 4X10 WHT NS (GAUZE/BANDAGES/DRESSINGS) ×3 IMPLANT
TAPE CLOTH SURG 4X10 WHT LF (GAUZE/BANDAGES/DRESSINGS) ×3 IMPLANT
TAPE UMBILICAL COTTON 1/8X30 (MISCELLANEOUS) ×6 IMPLANT
TOWEL OR 17X24 6PK STRL BLUE (TOWEL DISPOSABLE) ×3 IMPLANT
TOWEL OR 17X26 10 PK STRL BLUE (TOWEL DISPOSABLE) ×3 IMPLANT
TRAY FOLEY CATH 16FRSI W/METER (SET/KITS/TRAYS/PACK) ×3 IMPLANT
WATER STERILE IRR 1000ML POUR (IV SOLUTION) ×3 IMPLANT
YANKAUER SUCT BULB TIP NO VENT (SUCTIONS) ×3 IMPLANT

## 2013-04-09 NOTE — Anesthesia Postprocedure Evaluation (Signed)
  Anesthesia Post-op Note  Patient: [name]  Procedure(s) Performed: Procedure(s) with comments: ANTERIOR CERVICAL DECOMPRESSION/DISCECTOMY FUSION 3 LEVELS (N/A) - Anterior cervical decompression fusion, cervical 4-5, cervical 5-6, cervical 6-7 with instrumentation and allograft  Patient Location: PACU  Anesthesia Type:General  Level of Consciousness: awake, alert  and oriented  Airway and Oxygen Therapy: Patient Spontanous Breathing  Post-op Pain: mild  Post-op Assessment: Post-op Vital signs reviewed, Patient's Cardiovascular Status Stable, Respiratory Function Stable, Patent Airway, No signs of Nausea or vomiting and Pain level controlled  Post-op Vital Signs: Reviewed and stable  Complications: No apparent anesthesia complications

## 2013-04-09 NOTE — H&P (Signed)
PREOPERATIVE H&P  Chief Complaint: left arm pain  HPI: [name] is a 63 y.o. female who presents with ongoing L arm pain x 6 months. 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 Patient has failed conservative treatment and wishes to proceed with surgery.  Past Medical History  Diagnosis Date  . GERD (gastroesophageal reflux disease)   . Headache(784.0)     last one was 'acouple' yrs  . Anxiety   . Depression     on meds for 'acouple of yrs now"  . Fibromyalgia   . Hypertension     dr Carolanne Grumbling   in [place]   Past Surgical History  Procedure Laterality Date  . Back surgery  2000 & 2001  . Cholecystectomy  1995  . Tubal ligation  1973   History   Social History  . Marital Status: Married    Spouse Name: N/A    Number of Children: N/A  . Years of Education: N/A   Social History Main Topics  . Smoking status: Former Smoker -- 1.00 packs/day for 40 years    Types: Cigarettes    Quit date: 03/26/2008  . Smokeless tobacco: None  . Alcohol Use: No  . Drug Use: No  . Sexual Activity: None   Other Topics Concern  . None   Social History Narrative  . None   No family history on file. Allergies  Allergen Reactions  . Chlorhexidine     ? Skin peeling   . Penicillins Other (See Comments)    Causes thrush  . Other Rash    Ivory soap causes rash   Prior to Admission medications   Medication Sig Start Date End Date Taking? Authorizing Provider  Calcium Carbonate (CALCIUM 600 PO) Take 600 mg by mouth daily.   Yes Historical Provider, MD  cetirizine (ZYRTEC) 10 MG tablet Take 10 mg by mouth 2 (two) times daily.   Yes Historical Provider, MD  citalopram (CELEXA) 20 MG tablet Take 20 mg by mouth daily.   Yes Historical Provider, MD  HYDROcodone-acetaminophen (NORCO) 10-325 MG per tablet Take 1 tablet by mouth every 6 (six) hours as needed for moderate pain.   Yes Historical Provider, MD  lisinopril (PRINIVIL,ZESTRIL) 20 MG tablet Take 20 mg by mouth at bedtime.   Yes Historical  Provider, MD  magnesium oxide (MAG-OX) 400 MG tablet Take 400 mg by mouth daily.   Yes Historical Provider, MD  metoprolol (LOPRESSOR) 50 MG tablet Take 50 mg by mouth 2 (two) times daily.   Yes Historical Provider, MD  Multiple Vitamin (MULTIVITAMIN WITH MINERALS) TABS Take 1 tablet by mouth daily. Women's over 50   Yes Historical Provider, MD  Omega-3 Fatty Acids (FISH OIL PO) Take 1,290 mg by mouth 2 (two) times daily.   Yes Historical Provider, MD  omeprazole (PRILOSEC OTC) 20 MG tablet Take 20 mg by mouth daily.   Yes Historical Provider, MD  pregabalin (LYRICA) 75 MG capsule Take 75 mg by mouth at bedtime.   Yes Historical Provider, MD  simvastatin (ZOCOR) 20 MG tablet Take 20 mg by mouth at bedtime.   Yes Historical Provider, MD  OVER THE COUNTER MEDICATION Take 1 tablet by mouth at bedtime. Protandim (dietary supplement)    Historical Provider, MD  potassium gluconate 595 MG TABS Take 595 mg by mouth daily.    Historical Provider, MD     All other systems have been reviewed and were otherwise negative with the exception of those mentioned in the HPI and as above.  Physical Exam: Filed Vitals:   04/09/13 0620  BP: 248/89  Pulse: 55  Temp: 97.9 F (36.6 C)  Resp: 18    General: Alert, no acute distress Cardiovascular: No pedal edema Respiratory: No cyanosis, no use of accessory musculature GI: No organomegaly, abdomen is soft and non-tender Skin: No lesions in the area of chief complaint Neurologic: Sensation intact distally Psychiatric: Patient is competent for consent with normal mood and affect Lymphatic: No axillary or cervical lymphadenopathy  MUSCULOSKELETAL: + spurling on left  Assessment/Plan: Left arm pain Plan for Procedure(s): ANTERIOR CERVICAL DECOMPRESSION/DISCECTOMY FUSION 3 LEVELS C4-7   Sinclair Ship, MD 04/09/2013 7:26 AM

## 2013-04-09 NOTE — Anesthesia Procedure Notes (Signed)
Procedure Name: Intubation Date/Time: 04/09/2013 7:39 AM Performed by: Daisy Lazar Pre-anesthesia Checklist: Patient identified, Emergency Drugs available, Suction available and Patient being monitored Patient Re-evaluated:Patient Re-evaluated prior to inductionOxygen Delivery Method: Circle system utilized Preoxygenation: Pre-oxygenation with 100% oxygen Intubation Type: IV induction Ventilation: Oral airway inserted - appropriate to patient size and Mask ventilation without difficulty Laryngoscope Size: Mac and 3 Grade View: Grade I Tube size: 7.5 mm Number of attempts: 1 Airway Equipment and Method: Video-laryngoscopy and Stylet Placement Confirmation: ETT inserted through vocal cords under direct vision,  positive ETCO2 and breath sounds checked- equal and bilateral Secured at: 20 cm Tube secured with: Tape Dental Injury: Teeth and Oropharynx as per pre-operative assessment and Dental damage  Comments: See q note

## 2013-04-09 NOTE — Progress Notes (Signed)
PHARMACIST - PHYSICIAN ORDER COMMUNICATION  CONCERNING: P&T Medication Policy on Medications  DESCRIPTION:  This patient's order for:  Potassium Gluconate  has been noted.  Potassium gluconate is an OTC product that contains only 35mEq of potassium.  The lowest amount of oral potassium on formulary at [facility] is 17mEq.  ACTION TAKEN: The pharmacy department is unable to verify this order at this time.  Please assess your patient's potassium and use potassium chloride as needed.  Norva Riffle 04/09/2013, 2:03 PM

## 2013-04-09 NOTE — Anesthesia Preprocedure Evaluation (Addendum)
Anesthesia Evaluation  Patient identified by MRN, date of birth, ID band Patient awake    Reviewed: Allergy & Precautions, H&P , NPO status , Patient's Chart, lab work & pertinent test results, reviewed documented beta blocker date and time   History of Anesthesia Complications Negative for: history of anesthetic complications  Airway Mallampati: III TM Distance: >3 FB Neck ROM: Limited    Dental  (+) Upper Dentures and Lower Dentures   Pulmonary neg shortness of breath, neg sleep apnea, neg pneumonia -, neg COPDneg recent URI, former smoker,    Pulmonary exam normal       Cardiovascular Exercise Tolerance: Good METS: 3 - Mets hypertension, Pt. on medications and Pt. on home beta blockers - angina- Past MI and - DOE Rhythm:Regular Rate:Normal     Neuro/Psych  Headaches, PSYCHIATRIC DISORDERS Depression Chronic head aches, neck and left shoulder pain with shooting pain down left arm, numbness intermittent in left arm, worse with extension. Impaired ambulation  Neuromuscular disease    GI/Hepatic Neg liver ROS, GERD-  Medicated and Controlled,  Endo/Other  negative endocrine ROS  Renal/GU negative Renal ROS  negative genitourinary   Musculoskeletal  (+) Fibromyalgia -, narcotic dependent  Abdominal   Peds  Hematology  (+) anemia ,   Anesthesia Other Findings   Reproductive/Obstetrics                          Anesthesia Physical Anesthesia Plan  ASA: III  Anesthesia Plan: General   Post-op Pain Management:    Induction: Intravenous  Airway Management Planned: Oral ETT  Additional Equipment: None  Intra-op Plan:   Post-operative Plan:   Informed Consent: I have reviewed the patients History and Physical, chart, labs and discussed the procedure including the risks, benefits and alternatives for the proposed anesthesia with the patient or authorized representative who has indicated  his/her understanding and acceptance.   Dental advisory given  Plan Discussed with: CRNA and Surgeon  Anesthesia Plan Comments:         Anesthesia Quick Evaluation

## 2013-04-09 NOTE — Progress Notes (Signed)
ANTIBIOTIC CONSULT NOTE - INITIAL  Pharmacy Consult for Vancomycin Indication: Surgical Prophylaxis   Allergies  Allergen Reactions  . Chlorhexidine     ? Skin peeling   . Penicillins Other (See Comments)    Causes thrush  . Other Rash    Ivory soap causes rash    Patient Measurements:   Adjusted Body Weight: 78 kg  Vital Signs: Temp: 99.1 F (37.3 C) (01/15 1338) BP: 177/73 mmHg (01/15 1338) Pulse Rate: 58 (01/15 1338) Intake/Output from previous day:   Intake/Output from this shift: Total I/O In: 1990 [P.O.:240; I.V.:1750] Out: 400 [Urine:300; Blood:100]  Labs: No results found for this basename: WBC, HGB, PLT, LABCREA, CREATININE,  in the last 72 hours The CrCl is unknown because both a height and weight (above a minimum accepted value) are required for this calculation. No results found for this basename: VANCOTROUGH, Corlis Leak, VANCORANDOM, GENTTROUGH, GENTPEAK, GENTRANDOM, TOBRATROUGH, TOBRAPEAK, TOBRARND, AMIKACINPEAK, AMIKACINTROU, AMIKACIN,  in the last 72 hours   Microbiology: Recent Results (from the past 720 hour(s))  SURGICAL PCR SCREEN     Status: Abnormal   Collection Time    04/01/13  9:29 AM      Result Value Range Status   MRSA, PCR NEGATIVE  NEGATIVE Final   Staphylococcus aureus POSITIVE (*) NEGATIVE Final   Comment:            The Xpert SA Assay (FDA     approved for NASAL specimens     in patients over 17 years of age),     is one component of     a comprehensive surveillance     program.  Test performance has     been validated by [employer] for patients greater     than or equal to 63 year old.     It is not intended     to diagnose infection nor to     guide or monitor treatment.    Medical History: Past Medical History  Diagnosis Date  . GERD (gastroesophageal reflux disease)   . Headache(784.0)     last one was 'acouple' yrs  . Anxiety   . Depression     on meds for 'acouple of yrs now"  . Fibromyalgia   .  Hypertension     dr Carolanne Grumbling   in [place]    Assessment: 63 YOF s/p anterior cervical decompression fusion, pharmacy is consulted to dose vancomycin for surgical prophylaxis d/t pt has penicillin allergy. One dose of vancomycin given before surgery at 0730. Scr 0.94 on 1/7, est. crcl ~ 65-75 ml/min. No drain, confirmed with RN  Goal of Therapy:  Prevent surgical infections  Plan:  - Vancomycin 1g IV x 1 at [facility] sign off, please re-consult if other antibiotics are needed.  Maryanna Shape, PharmD, BCPS  Clinical Pharmacist  Pager: [phone]   04/09/2013,1:41 PM

## 2013-04-09 NOTE — Progress Notes (Signed)
Report to Dr. Tamala Julian regarding BP & the fact that the pt. Had small amount of pepsi this a.m.

## 2013-04-09 NOTE — Progress Notes (Signed)
Pt. Remarks that 4 days after last surgery she had skin peeling & she was sure it was a result of the chlorhexidine, pt. Refused shower /w CHG but before the secretary knew of this ?sensitivity pt. Was given wipes /w CHG, she used them but thus far no adverse reaction. Pt. BP 248/89, pt. also admits that she had 2 tsp. Of pepsi this a.m. /w meds. Pt. Has breakdown of skin on L lower leg, reports its related to Cat injury back in November 2014. Area excoriated, encompassing approx. 3 cms.

## 2013-04-09 NOTE — Progress Notes (Signed)
Orthopedic Tech Progress Note Patient Details:  [name] [facility] 12/14/1950 [id number]  Ortho Devices Type of Ortho Device: Hard collar Ortho Device/Splint Location: neck/ shower collar Ortho Device/Splint Interventions: [name], [name] 04/09/2013, 2:21 PM

## 2013-04-09 NOTE — Progress Notes (Signed)
Utilization review completed.  

## 2013-04-09 NOTE — Transfer of Care (Signed)
Immediate Anesthesia Transfer of Care Note  Patient: [name]  Procedure(s) Performed: Procedure(s) with comments: ANTERIOR CERVICAL DECOMPRESSION/DISCECTOMY FUSION 3 LEVELS (N/A) - Anterior cervical decompression fusion, cervical 4-5, cervical 5-6, cervical 6-7 with instrumentation and allograft  Patient Location: PACU  Anesthesia Type:General  Level of Consciousness: awake and alert   Airway & Oxygen Therapy: Patient Spontanous Breathing and Patient connected to face mask oxygen  Post-op Assessment: Report given to PACU RN and Post -op Vital signs reviewed and stable  Post vital signs: Reviewed and stable  Complications: No apparent anesthesia complications

## 2013-04-10 ENCOUNTER — Encounter (HOSPITAL_COMMUNITY): Payer: Self-pay | Admitting: Orthopedic Surgery

## 2013-04-10 NOTE — Op Note (Signed)
NAMEJANEE, [name]                ACCOUNT NO.:  [id number]  MEDICAL RECORD NO.:  [id number]  LOCATION:  3C05C                        FACILITY:  [facility]  PHYSICIAN:  Phylliss Bob, MD      DATE OF BIRTH:  10-Nov-1950  DATE OF PROCEDURE:  04/09/2013                              OPERATIVE REPORT   PREOPERATIVE DIAGNOSES: 1. Spinal cord compression. 2. Right-sided cervical radiculopathy. 3. Left-sided cervical radiculopathy.  POSTOPERATIVE DIAGNOSES: 1. Spinal cord compression. 2. Right-sided cervical radiculopathy. 3. Left-sided cervical radiculopathy.  PROCEDURE: 1. Anterior cervical decompression and fusion C4-5, C5-6, C6-7. 2. Placement of anterior instrumentation, C4-C7. 3. Insertion of interbody device x3 (Titan interbody spacers). 4. Use of morselized allograft. 5. Intraoperative use of fluoroscopy.  SURGEON:  Phylliss Bob, MD  ASSISTANT:  Elza Rafter, PA-C  ANESTHESIA:  General endotracheal anesthesia.  COMPLICATIONS:  None.  DISPOSITION:  Stable.  ESTIMATED BLOOD LOSS:  Minimal.  INDICATIONS FOR PROCEDURE:  Briefly, [name] is a very pleasant 63- year-old female who did present to me with neck pain as well as severe pain in her left arm.  An MRI did reveal foraminal stenosis on the left side the varying degrees from C4-C7, as well as spinal cord compression at C4-5, as well as severe degenerative disk disease from C4-C7.  The patient failed conservative care, and we did have a discussion regarding proceeding with an anterior cervical decompression and fusion.  The patient did fully understand the risks and limitations of the procedure as outlined in my preoperative note.  OPERATIVE DETAILS:  On April 09, 2013, the patient was brought to surgery and general endotracheal anesthesia was administered.  The patient was placed supine on a well padded hospital bed.  Antibiotics were given and a time-out procedure was performed.  The neck was prepped and  draped in the usual sterile fashion.  I then made a left-sided transverse incision overlying the C5-6 interspace.  The platysma was sharply incised.  The plane between the sternocleidomastoid muscle and the strap muscles was identified and explored.  The anterior spine was noted.  A self-retaining Shadow-Line retractor was placed, retracting the carotid artery laterally in the esophagus and trachea medially.  A fluoroscopic view to confirm the appropriate operative level.  I then subperiosteally exposed the vertebral bodies from C4-C7.  Significant anterior osteophytes were identified and removed using a rongeur.  I then placed Caspar pins into the C6 and C7 vertebral bodies and distraction was applied.  I then went forward with a full thorough diskectomy.  The posterior longitudinal ligament was identified and entered using a nerve hook.  I then used a series of Kerrison punches to perform a thorough and complete neural foraminal decompression on the left side.  A central decompression was also performed.  After preparing the endplates, I did pack an appropriate-sized interbody spacer with DBX mix, which was tamped into position in the usual fashion.  The Caspar pin was removed from the C7 vertebral body and bone wax was placed in its place.  I then performed a C5-6 ACDF in the manner previously described.  I was able to confirm a thorough decompression bilaterally. Once again, after preparing the endplates, an  interbody spacer was packed with DBX mix and tamped into position in the usual fashion.  At the C4-5 level, a diskectomy was performed as just described.  Once again, a thorough decompression was confirmed using a nerve hook.  Once again, after endplate preparation, an appropriate-sized interbody spacer was packed with DBX mix and tamped into position.  I then chose an appropriate-sized anterior cervical plate, which was placed over the anterior cervical spine.  Two vertebral body  screws were placed in each vertebral bodies from C4-C7 for a total of a vertebral body screws.  The screws were locked into the plate.  The wound was then copiously irrigated.  The wound was explored for any undue bleeding, and none was encountered.  The platysma was then closed using 2-0 Vicryl, and the skin was closed using 3-0 Monocryl.  Benzoin and Steri-Strips were applied, followed by a sterile dressing.  All instrument counts were correct at the termination of the procedure.  Of note, Elza Rafter was my assistant throughout the entirety of the procedure, and did aide in essential retraction and suctioning needed throughout the surgery.     Phylliss Bob, MD     MD/MEDQ  D:  04/09/2013  T:  04/10/2013  Job:  [id number]

## 2013-04-10 NOTE — Progress Notes (Signed)
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 Reports resolved arm pain, minimal throat soreness and swallowing difficulties. Minimal posterior neck pain. Has been ambulating and tolerating soft foods. She could not be more pleased with her results, she is all smiles this morning. Denies N/V/SOB/HA (HA overnight has resolved) having NL BM  BP 130/78  Pulse 80  Temp(Src) 99.5 F (37.5 C) (Oral)  Resp 16  SpO2 93% Pt ambulating in hallway upon arrival comfortably, hard collar in place, dressing CDI. Minimal TTP posterior cervical musculature. -Homans, NVI.   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

## 2013-04-10 NOTE — Progress Notes (Signed)
Pt. discharged home accompanied by husband. Prescriptions and discharge instructions given with verbalization of understanding. Incision site on neck with no s/s of infection - no swelling, redness, bleeding, and/or drainage noted. Soft collar intact. Opportunity given to ask questions and questions answered. Pt. transported out of this unit in wheelchair by the volunteer.

## 2013-04-15 NOTE — Discharge Summary (Signed)
Patient ID: [name] MRN: [id number] DOB/AGE: [birth year] 63 y.o.  Admit date: 04/09/2013 Discharge date: 04/10/2013  Admission Diagnoses: Myeloradiculopathy  Discharge Diagnoses:  Same  Past Medical History  Diagnosis Date  . GERD (gastroesophageal reflux disease)   . Headache(784.0)     last one was 'acouple' yrs  . Anxiety   . Depression     on meds for 'acouple of yrs now"  . Fibromyalgia   . Hypertension     dr Carolanne Grumbling   in [place]    Surgeries: Procedure(s): ANTERIOR CERVICAL DECOMPRESSION/DISCECTOMY FUSION 3 LEVELS C4-7 on 04/09/2013   Discharged Condition: Improved  Hospital Course: [name] is an 63 y.o. female who was admitted 04/09/2013 for operative treatment of myeloradiculopathy. Patient has severe unremitting pain that affects sleep, daily activities, and work/hobbies. After pre-op clearance the patient was taken to the operating room on 04/09/2013 and underwent  Procedure(s): ANTERIOR CERVICAL DECOMPRESSION/DISCECTOMY FUSION 3 LEVELS C4-7.    Patient was given perioperative antibiotics:  Anti-infectives   Start     Dose/Rate Route Frequency Ordered Stop   04/09/13 1930  vancomycin (VANCOCIN) IVPB 1000 mg/200 mL premix     1,000 mg 200 mL/hr over 60 Minutes Intravenous  Once 04/09/13 1339 04/09/13 2051   04/09/13 0600  vancomycin (VANCOCIN) IVPB 1000 mg/200 mL premix     1,000 mg 200 mL/hr over 60 Minutes Intravenous On call to O.R. 04/08/13 1359 04/09/13 0830       Patient was given sequential compression devices, early ambulation to prevent DVT.  Patient benefited maximally from hospital stay and there were no complications.    Recent vital signs: BP 154/89  Pulse 80  Temp(Src) 100.2 F (37.9 C) (Oral)  Resp 20  SpO2 93%  Discharge Medications:     Medication List    STOP taking these medications       FISH OIL PO     HYDROcodone-acetaminophen 10-325 MG per tablet  Commonly known as:  NORCO     magnesium oxide 400 MG tablet   Commonly known as:  MAG-OX      TAKE these medications       CALCIUM 600 PO  Take 600 mg by mouth daily.     cetirizine 10 MG tablet  Commonly known as:  ZYRTEC  Take 10 mg by mouth 2 (two) times daily.     citalopram 20 MG tablet  Commonly known as:  CELEXA  Take 20 mg by mouth daily.     lisinopril 20 MG tablet  Commonly known as:  PRINIVIL,ZESTRIL  Take 20 mg by mouth at bedtime.     metoprolol 50 MG tablet  Commonly known as:  LOPRESSOR  Take 50 mg by mouth 2 (two) times daily.     multivitamin with minerals Tabs tablet  Take 1 tablet by mouth daily. Women's over 50     omeprazole 20 MG tablet  Commonly known as:  PRILOSEC OTC  Take 20 mg by mouth daily.     OVER THE COUNTER MEDICATION  Take 1 tablet by mouth at bedtime. Protandim (dietary supplement)     potassium gluconate 595 MG Tabs tablet  Take 595 mg by mouth daily.     pregabalin 75 MG capsule  Commonly known as:  LYRICA  Take 75 mg by mouth at bedtime.     simvastatin 20 MG tablet  Commonly known as:  ZOCOR  Take 20 mg by mouth at bedtime.        Diagnostic  Studies: Dg Cervical Spine 1 View  04/09/2013   CLINICAL DATA:  C4 through C7 ACDF  EXAM: DG C-ARM 1-60 MIN; DG CERVICAL SPINE - 1 VIEW  COMPARISON:  MRI of the cervical spine October 09, 2012  FINDINGS: Lateral fluoroscopic spot view of the cervical spine, radiologist was not present at time of image acquisition.  C4-5 through C6-7 interbody discs prosthesis and anterior plate and screw fixation consistent with ACDF. Life-support lines within the neck.  IMPRESSION: C4-5 through C6-7 ACDF.   Electronically Signed   By: Elon Alas   On: 04/09/2013 14:14   Dg C-arm 1-60 Min  04/09/2013   CLINICAL DATA:  C4 through C7 ACDF  EXAM: DG C-ARM 1-60 MIN; DG CERVICAL SPINE - 1 VIEW  COMPARISON:  MRI of the cervical spine October 09, 2012  FINDINGS: Lateral fluoroscopic spot view of the cervical spine, radiologist was not present at time of image  acquisition.  C4-5 through C6-7 interbody discs prosthesis and anterior plate and screw fixation consistent with ACDF. Life-support lines within the neck.  IMPRESSION: C4-5 through C6-7 ACDF.   Electronically Signed   By: Elon Alas   On: 04/09/2013 14:14    Disposition: 01-Home or Self Care   1 Day PO C4-7 ACDF for myeloradiculopathy  -Arm pain resolved completely  -Minimal posterior neck pain  -well controlled on percocet and Valium  -D/C home on these, written scripts signed and in chart  -D/C home today  -D/C instructions printed and in chart  -[place] Collar at bedside for showering, use after 5 days  -F/U in office 2 weeks  Signed: Justice Britain 04/15/2013, 10:18 AM

## 2013-04-22 DIAGNOSIS — I10 Essential (primary) hypertension: Secondary | ICD-10-CM | POA: Diagnosis not present

## 2013-04-22 DIAGNOSIS — Z6829 Body mass index (BMI) 29.0-29.9, adult: Secondary | ICD-10-CM | POA: Diagnosis not present

## 2013-05-06 DIAGNOSIS — R609 Edema, unspecified: Secondary | ICD-10-CM | POA: Diagnosis not present

## 2013-05-06 DIAGNOSIS — E785 Hyperlipidemia, unspecified: Secondary | ICD-10-CM | POA: Diagnosis not present

## 2013-05-06 DIAGNOSIS — I10 Essential (primary) hypertension: Secondary | ICD-10-CM | POA: Diagnosis not present

## 2013-05-06 DIAGNOSIS — Z6829 Body mass index (BMI) 29.0-29.9, adult: Secondary | ICD-10-CM | POA: Diagnosis not present

## 2013-05-26 DIAGNOSIS — M4712 Other spondylosis with myelopathy, cervical region: Secondary | ICD-10-CM | POA: Diagnosis not present

## 2013-07-07 DIAGNOSIS — M4712 Other spondylosis with myelopathy, cervical region: Secondary | ICD-10-CM | POA: Diagnosis not present

## 2013-07-10 DIAGNOSIS — E785 Hyperlipidemia, unspecified: Secondary | ICD-10-CM | POA: Diagnosis not present

## 2013-07-10 DIAGNOSIS — M810 Age-related osteoporosis without current pathological fracture: Secondary | ICD-10-CM | POA: Diagnosis not present

## 2013-07-10 DIAGNOSIS — Z6830 Body mass index (BMI) 30.0-30.9, adult: Secondary | ICD-10-CM | POA: Diagnosis not present

## 2013-07-10 DIAGNOSIS — I10 Essential (primary) hypertension: Secondary | ICD-10-CM | POA: Diagnosis not present

## 2013-07-10 DIAGNOSIS — R609 Edema, unspecified: Secondary | ICD-10-CM | POA: Diagnosis not present

## 2013-07-14 DIAGNOSIS — M4712 Other spondylosis with myelopathy, cervical region: Secondary | ICD-10-CM | POA: Diagnosis not present

## 2013-08-13 DIAGNOSIS — IMO0001 Reserved for inherently not codable concepts without codable children: Secondary | ICD-10-CM | POA: Diagnosis not present

## 2013-08-13 DIAGNOSIS — R5381 Other malaise: Secondary | ICD-10-CM | POA: Diagnosis not present

## 2013-08-13 DIAGNOSIS — E538 Deficiency of other specified B group vitamins: Secondary | ICD-10-CM | POA: Diagnosis not present

## 2013-08-13 DIAGNOSIS — F329 Major depressive disorder, single episode, unspecified: Secondary | ICD-10-CM | POA: Diagnosis not present

## 2013-08-13 DIAGNOSIS — R5383 Other fatigue: Secondary | ICD-10-CM | POA: Diagnosis not present

## 2013-08-13 DIAGNOSIS — F3289 Other specified depressive episodes: Secondary | ICD-10-CM | POA: Diagnosis not present

## 2013-08-13 DIAGNOSIS — D649 Anemia, unspecified: Secondary | ICD-10-CM | POA: Diagnosis not present

## 2013-08-13 DIAGNOSIS — Z6829 Body mass index (BMI) 29.0-29.9, adult: Secondary | ICD-10-CM | POA: Diagnosis not present

## 2013-08-18 DIAGNOSIS — Z1212 Encounter for screening for malignant neoplasm of rectum: Secondary | ICD-10-CM | POA: Diagnosis not present

## 2013-08-26 DIAGNOSIS — F329 Major depressive disorder, single episode, unspecified: Secondary | ICD-10-CM | POA: Diagnosis not present

## 2013-08-26 DIAGNOSIS — D509 Iron deficiency anemia, unspecified: Secondary | ICD-10-CM | POA: Diagnosis not present

## 2013-08-26 DIAGNOSIS — N289 Disorder of kidney and ureter, unspecified: Secondary | ICD-10-CM | POA: Diagnosis not present

## 2013-08-26 DIAGNOSIS — F3289 Other specified depressive episodes: Secondary | ICD-10-CM | POA: Diagnosis not present

## 2013-08-26 DIAGNOSIS — Z6830 Body mass index (BMI) 30.0-30.9, adult: Secondary | ICD-10-CM | POA: Diagnosis not present

## 2013-08-26 DIAGNOSIS — IMO0001 Reserved for inherently not codable concepts without codable children: Secondary | ICD-10-CM | POA: Diagnosis not present

## 2013-08-27 DIAGNOSIS — N289 Disorder of kidney and ureter, unspecified: Secondary | ICD-10-CM | POA: Diagnosis not present

## 2013-09-01 DIAGNOSIS — Z800 Family history of malignant neoplasm of digestive organs: Secondary | ICD-10-CM | POA: Diagnosis not present

## 2013-09-01 DIAGNOSIS — Z1211 Encounter for screening for malignant neoplasm of colon: Secondary | ICD-10-CM | POA: Diagnosis not present

## 2013-09-01 DIAGNOSIS — Z86010 Personal history of colonic polyps: Secondary | ICD-10-CM | POA: Diagnosis not present

## 2013-09-01 DIAGNOSIS — D509 Iron deficiency anemia, unspecified: Secondary | ICD-10-CM | POA: Diagnosis not present

## 2013-09-02 DIAGNOSIS — N289 Disorder of kidney and ureter, unspecified: Secondary | ICD-10-CM | POA: Diagnosis not present

## 2013-09-02 DIAGNOSIS — D631 Anemia in chronic kidney disease: Secondary | ICD-10-CM | POA: Diagnosis not present

## 2013-09-02 DIAGNOSIS — F99 Mental disorder, not otherwise specified: Secondary | ICD-10-CM | POA: Diagnosis not present

## 2013-09-02 DIAGNOSIS — D509 Iron deficiency anemia, unspecified: Secondary | ICD-10-CM | POA: Diagnosis not present

## 2013-09-02 DIAGNOSIS — Z6830 Body mass index (BMI) 30.0-30.9, adult: Secondary | ICD-10-CM | POA: Diagnosis not present

## 2013-09-02 DIAGNOSIS — N039 Chronic nephritic syndrome with unspecified morphologic changes: Secondary | ICD-10-CM | POA: Diagnosis not present

## 2013-09-11 DIAGNOSIS — D649 Anemia, unspecified: Secondary | ICD-10-CM | POA: Diagnosis not present

## 2013-09-18 DIAGNOSIS — D509 Iron deficiency anemia, unspecified: Secondary | ICD-10-CM | POA: Diagnosis not present

## 2013-09-18 DIAGNOSIS — Z09 Encounter for follow-up examination after completed treatment for conditions other than malignant neoplasm: Secondary | ICD-10-CM | POA: Diagnosis not present

## 2013-09-22 DIAGNOSIS — D509 Iron deficiency anemia, unspecified: Secondary | ICD-10-CM | POA: Diagnosis not present

## 2013-10-07 ENCOUNTER — Encounter: Payer: Self-pay | Admitting: Gastroenterology

## 2013-10-07 DIAGNOSIS — K648 Other hemorrhoids: Secondary | ICD-10-CM | POA: Diagnosis not present

## 2013-10-07 DIAGNOSIS — Z800 Family history of malignant neoplasm of digestive organs: Secondary | ICD-10-CM | POA: Diagnosis not present

## 2013-10-07 DIAGNOSIS — D509 Iron deficiency anemia, unspecified: Secondary | ICD-10-CM | POA: Diagnosis not present

## 2013-10-07 DIAGNOSIS — E7800 Pure hypercholesterolemia, unspecified: Secondary | ICD-10-CM | POA: Diagnosis not present

## 2013-10-07 DIAGNOSIS — I10 Essential (primary) hypertension: Secondary | ICD-10-CM | POA: Diagnosis not present

## 2013-10-07 DIAGNOSIS — E559 Vitamin D deficiency, unspecified: Secondary | ICD-10-CM | POA: Diagnosis not present

## 2013-10-07 DIAGNOSIS — F3289 Other specified depressive episodes: Secondary | ICD-10-CM | POA: Diagnosis not present

## 2013-10-07 DIAGNOSIS — Z79899 Other long term (current) drug therapy: Secondary | ICD-10-CM | POA: Diagnosis not present

## 2013-10-07 DIAGNOSIS — Z87891 Personal history of nicotine dependence: Secondary | ICD-10-CM | POA: Diagnosis not present

## 2013-10-07 DIAGNOSIS — IMO0001 Reserved for inherently not codable concepts without codable children: Secondary | ICD-10-CM | POA: Diagnosis not present

## 2013-10-07 DIAGNOSIS — F329 Major depressive disorder, single episode, unspecified: Secondary | ICD-10-CM | POA: Diagnosis not present

## 2013-10-07 DIAGNOSIS — K5730 Diverticulosis of large intestine without perforation or abscess without bleeding: Secondary | ICD-10-CM | POA: Diagnosis not present

## 2013-10-07 DIAGNOSIS — Q438 Other specified congenital malformations of intestine: Secondary | ICD-10-CM | POA: Diagnosis not present

## 2013-10-07 DIAGNOSIS — K644 Residual hemorrhoidal skin tags: Secondary | ICD-10-CM | POA: Diagnosis not present

## 2013-10-07 DIAGNOSIS — Z86010 Personal history of colonic polyps: Secondary | ICD-10-CM | POA: Diagnosis not present

## 2013-10-07 DIAGNOSIS — Z1211 Encounter for screening for malignant neoplasm of colon: Secondary | ICD-10-CM | POA: Diagnosis not present

## 2013-10-09 DIAGNOSIS — N183 Chronic kidney disease, stage 3 unspecified: Secondary | ICD-10-CM | POA: Diagnosis not present

## 2013-10-09 DIAGNOSIS — Z6829 Body mass index (BMI) 29.0-29.9, adult: Secondary | ICD-10-CM | POA: Diagnosis not present

## 2013-10-09 DIAGNOSIS — E785 Hyperlipidemia, unspecified: Secondary | ICD-10-CM | POA: Diagnosis not present

## 2013-10-09 DIAGNOSIS — D509 Iron deficiency anemia, unspecified: Secondary | ICD-10-CM | POA: Diagnosis not present

## 2013-10-09 DIAGNOSIS — I10 Essential (primary) hypertension: Secondary | ICD-10-CM | POA: Diagnosis not present

## 2013-10-20 DIAGNOSIS — D509 Iron deficiency anemia, unspecified: Secondary | ICD-10-CM | POA: Diagnosis not present

## 2013-10-20 DIAGNOSIS — R1310 Dysphagia, unspecified: Secondary | ICD-10-CM | POA: Diagnosis not present

## 2013-10-20 DIAGNOSIS — K219 Gastro-esophageal reflux disease without esophagitis: Secondary | ICD-10-CM | POA: Diagnosis not present

## 2013-10-30 DIAGNOSIS — K219 Gastro-esophageal reflux disease without esophagitis: Secondary | ICD-10-CM | POA: Diagnosis not present

## 2013-10-30 DIAGNOSIS — R1310 Dysphagia, unspecified: Secondary | ICD-10-CM | POA: Diagnosis not present

## 2013-10-30 DIAGNOSIS — D509 Iron deficiency anemia, unspecified: Secondary | ICD-10-CM | POA: Diagnosis not present

## 2013-10-30 DIAGNOSIS — K2960 Other gastritis without bleeding: Secondary | ICD-10-CM | POA: Diagnosis not present

## 2013-10-30 DIAGNOSIS — D1330 Benign neoplasm of unspecified part of small intestine: Secondary | ICD-10-CM | POA: Diagnosis not present

## 2013-10-30 DIAGNOSIS — D131 Benign neoplasm of stomach: Secondary | ICD-10-CM | POA: Diagnosis not present

## 2013-10-30 DIAGNOSIS — K222 Esophageal obstruction: Secondary | ICD-10-CM | POA: Diagnosis not present

## 2013-11-06 DIAGNOSIS — K219 Gastro-esophageal reflux disease without esophagitis: Secondary | ICD-10-CM | POA: Diagnosis not present

## 2013-11-06 DIAGNOSIS — IMO0001 Reserved for inherently not codable concepts without codable children: Secondary | ICD-10-CM | POA: Diagnosis not present

## 2013-11-06 DIAGNOSIS — Z6829 Body mass index (BMI) 29.0-29.9, adult: Secondary | ICD-10-CM | POA: Diagnosis not present

## 2013-11-06 DIAGNOSIS — D509 Iron deficiency anemia, unspecified: Secondary | ICD-10-CM | POA: Diagnosis not present

## 2013-11-16 DIAGNOSIS — D649 Anemia, unspecified: Secondary | ICD-10-CM | POA: Diagnosis not present

## 2013-11-19 DIAGNOSIS — D509 Iron deficiency anemia, unspecified: Secondary | ICD-10-CM | POA: Diagnosis not present

## 2013-11-23 IMAGING — CR DG LUMBAR SPINE 2-3V
2 series · 2 of 2 positions shown · non-contrast
Comparison: 10/29/2012

CLINICAL DATA: Back surgery, L4-5 unilateral fusion

LUMBAR SPINE - 2-3 VIEW

[AP]
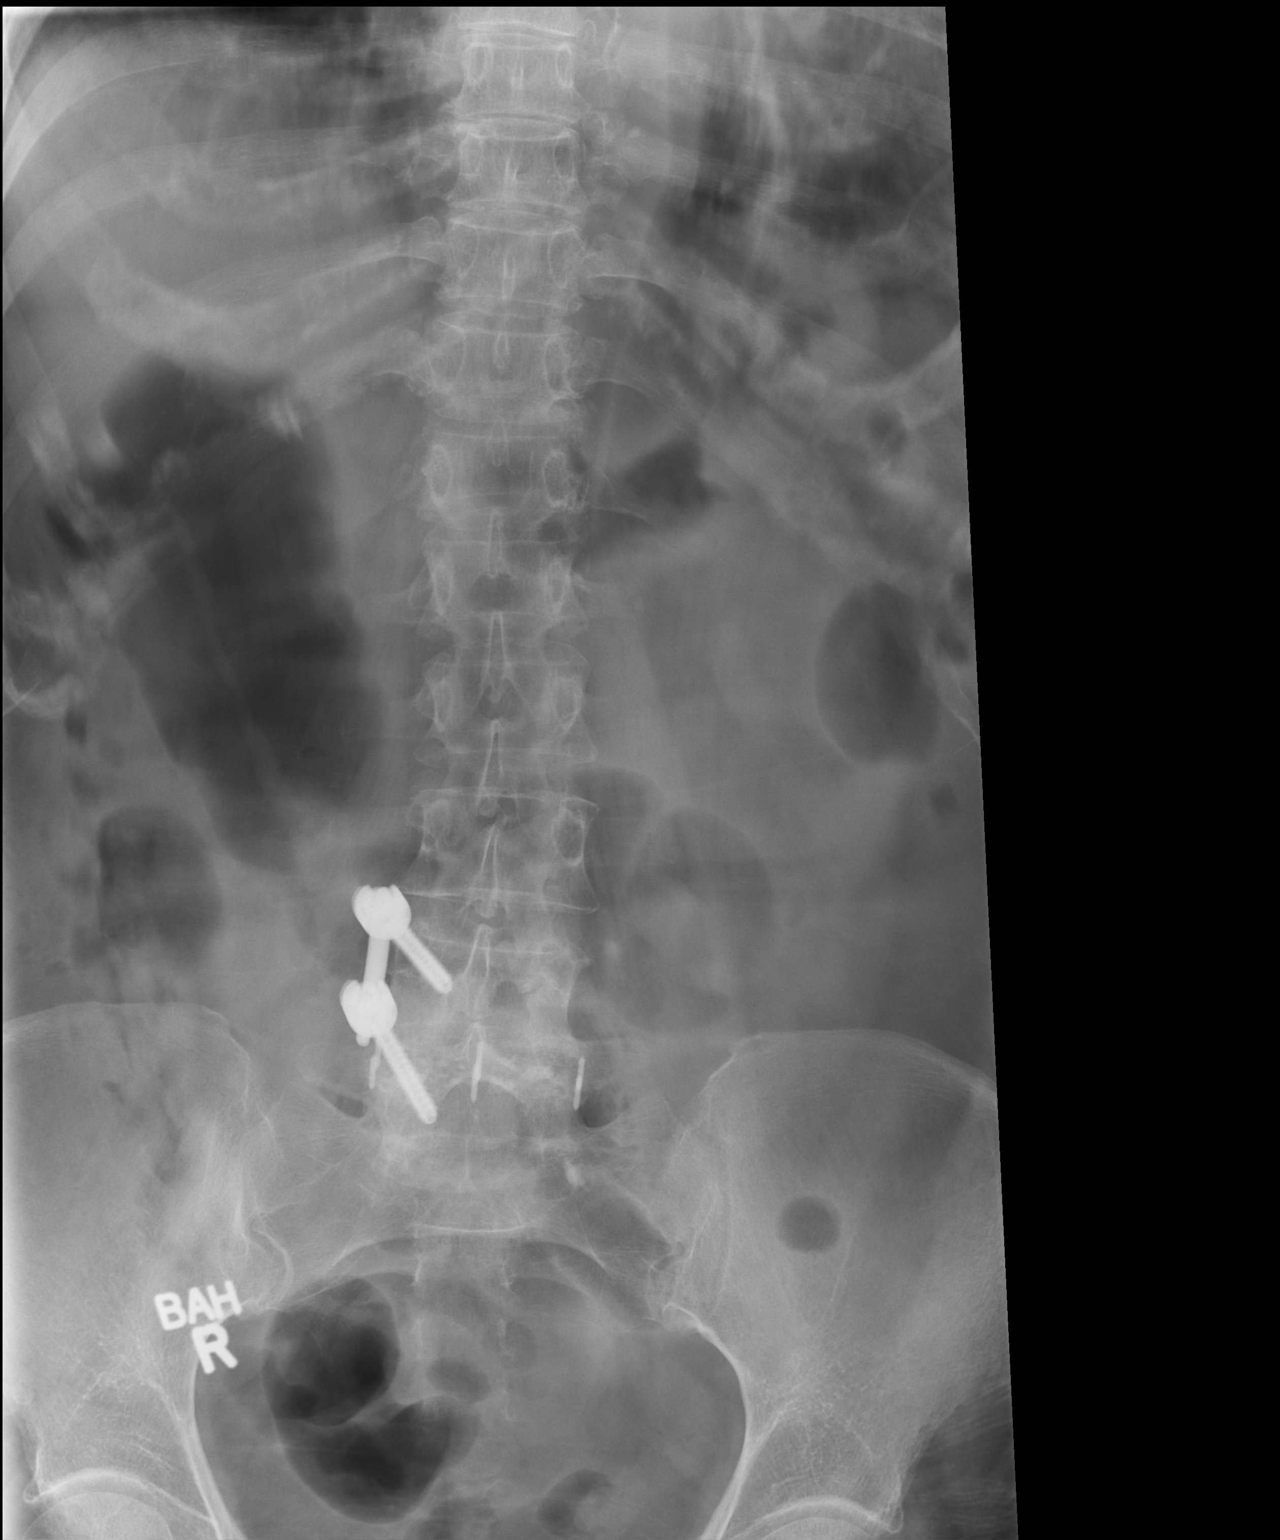

[lateral]
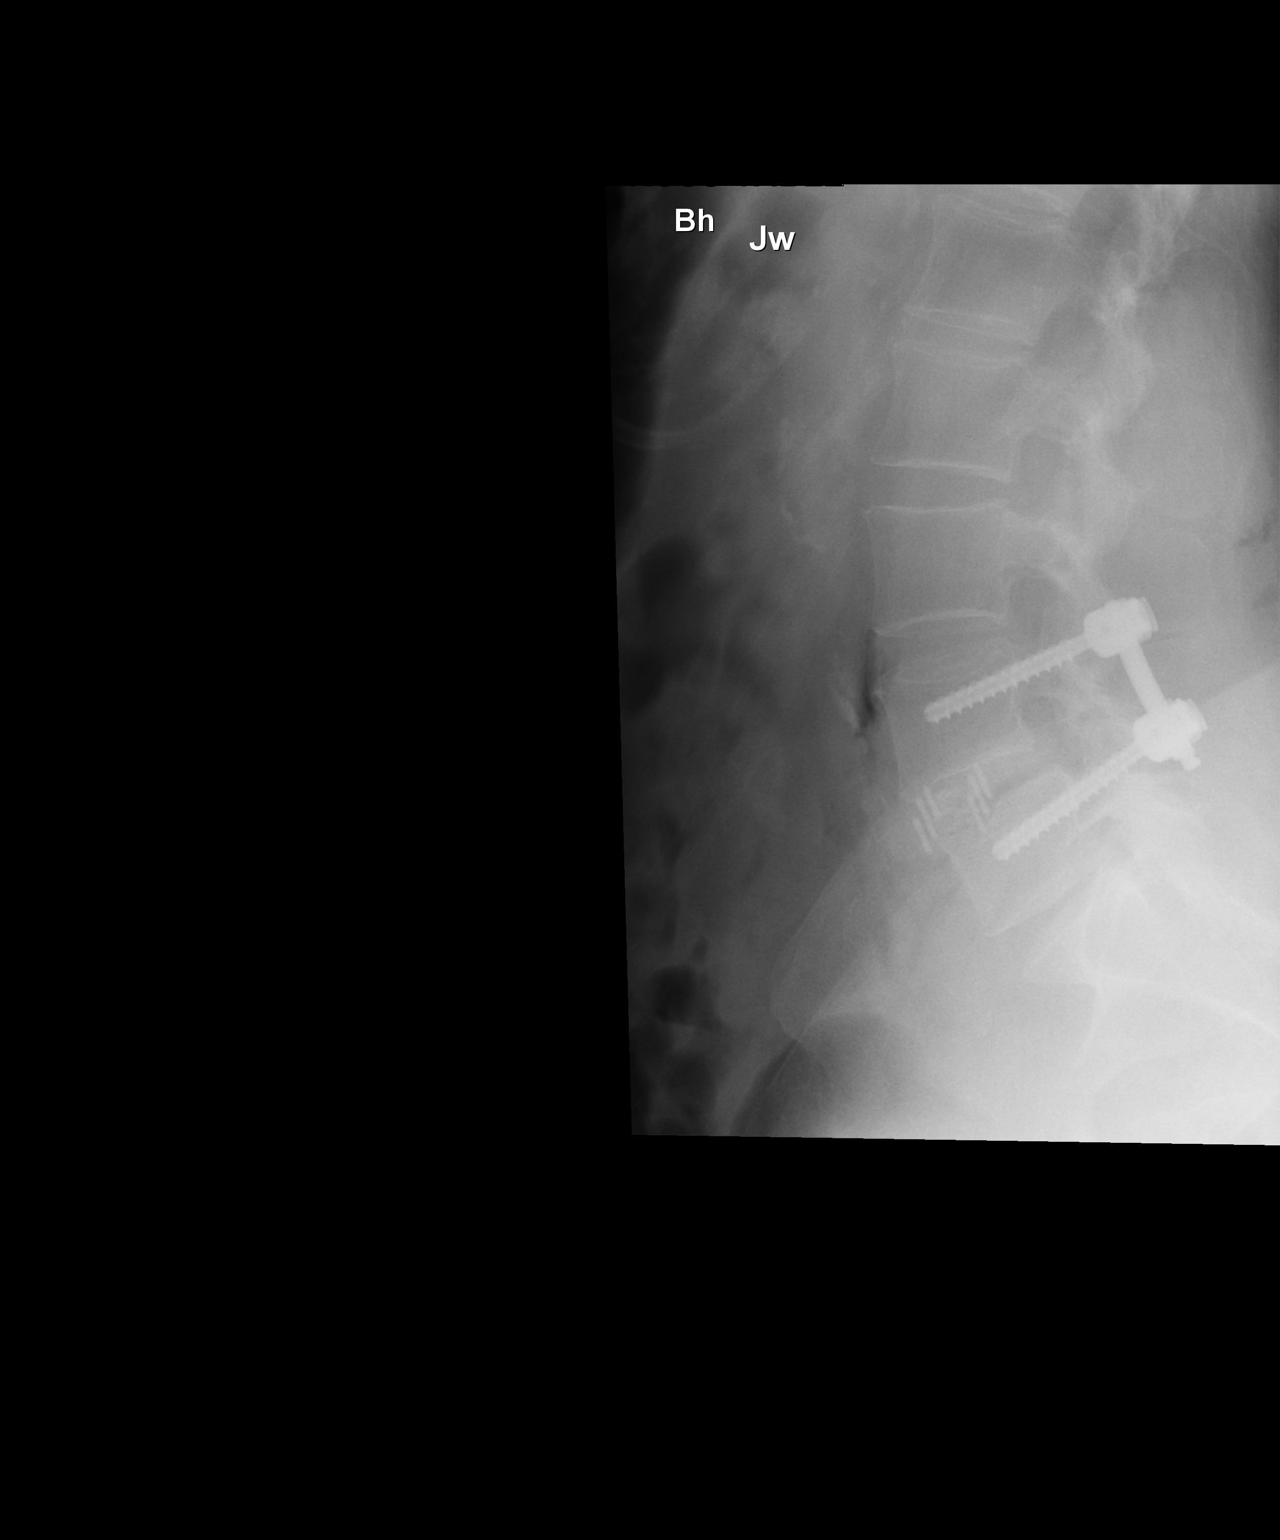

[2 of 2 positions shown; findings below may reference images not displayed]

FINDINGS: Right unipedicular posterior fusion with a disc spacer at
L4-5. Alignment appears anatomic.  No acute osseous or hardware
abnormality.  Nonobstructive bowel gas pattern.
IMPRESSION: Status post L4-5 unilateral fusion on the right.  Anatomic
alignment.

## 2013-11-23 IMAGING — RF DG C-ARM GT 120 MIN
1 series · 2 of 2 positions shown · non-contrast
Comparison: MRI lumbar spine 04/17/2012

CLINICAL DATA: L4-L5 XLIF

DG C-ARM GT 120 MIN,LUMBAR SPINE - 2-3 VIEW
TECHNIQUE: Anterior posterior spot radiographs obtained
intraoperatively

[Series 1: run · 2 of 2 slices shown]
[im 1/2]
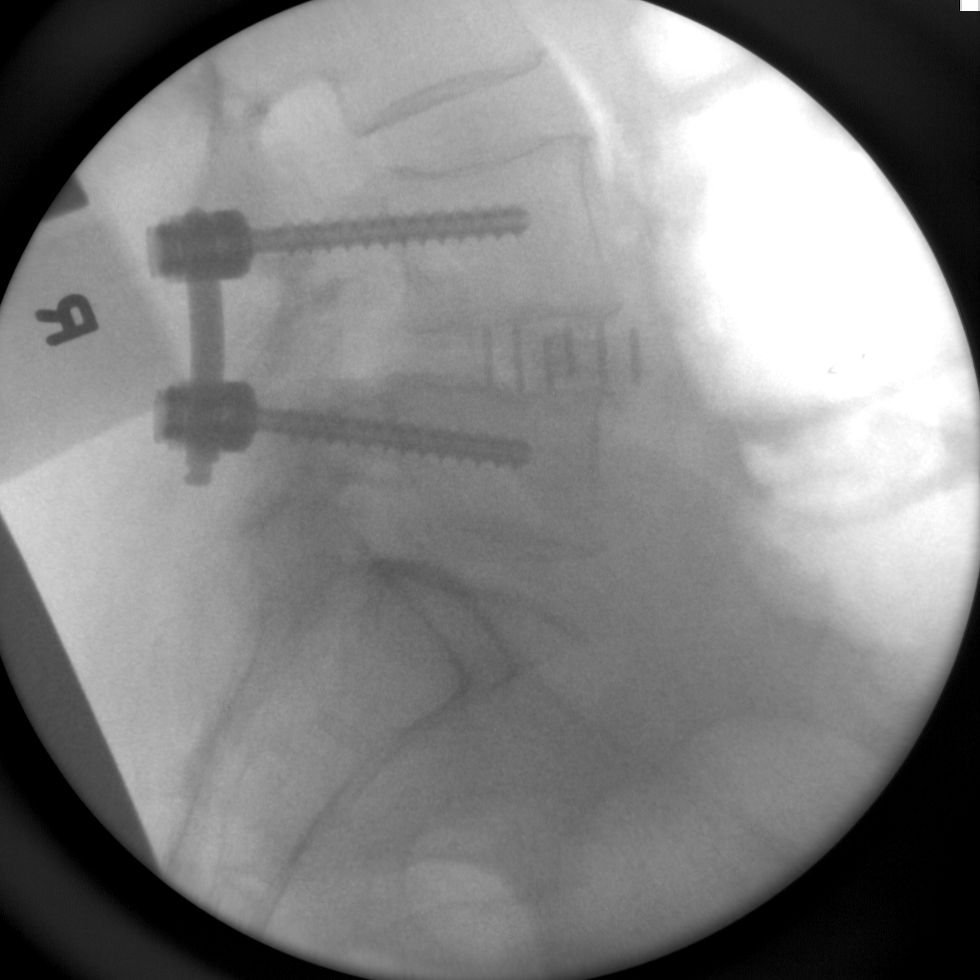
[im 2/2]
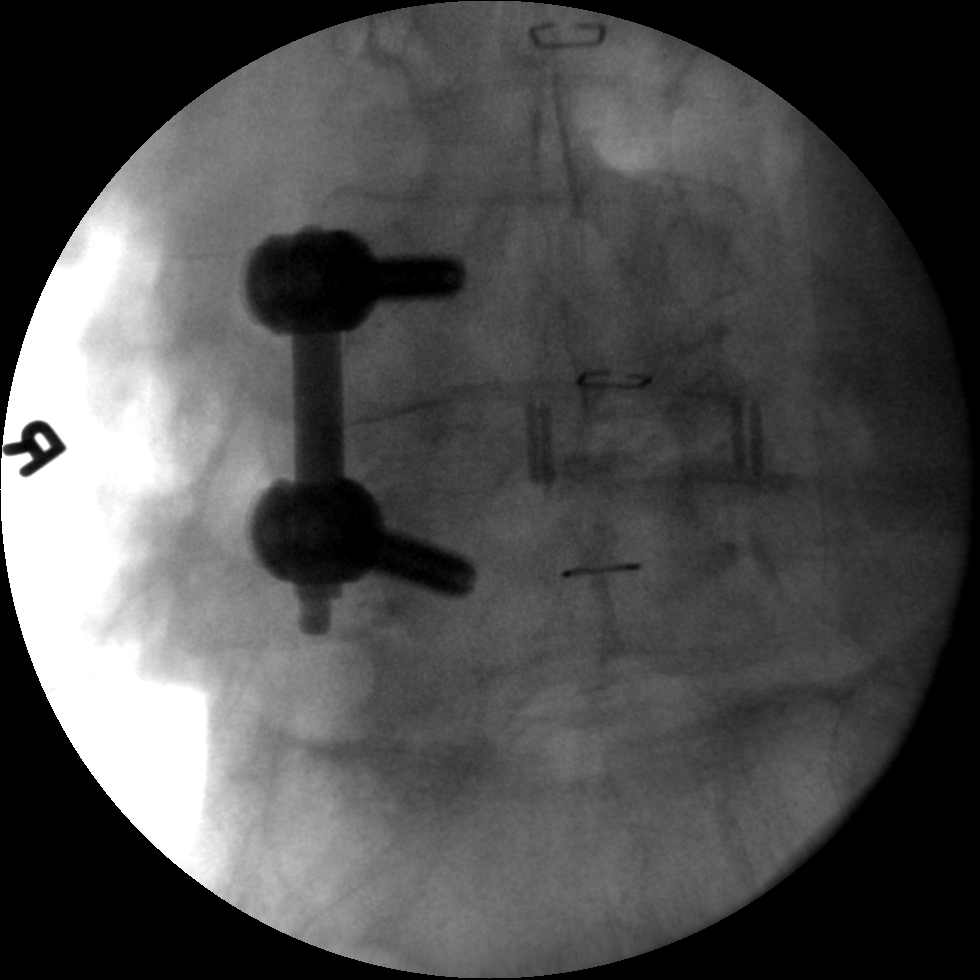

[2 of 2 positions shown; findings below may reference images not displayed]

FINDINGS: Frontal and lateral spot films demonstrate right L4 and
L5 pedicle screws with a single rod and interbody spacer consistent
with extreme lateral internal fixation from a right approach.  No
evidence of immediate hardware complication.  Minimal
anterolisthesis of L4-L5 similar to the preoperative MRI.
IMPRESSION: Right approach XLIF of L4-L5 as above.

## 2013-12-10 DIAGNOSIS — IMO0001 Reserved for inherently not codable concepts without codable children: Secondary | ICD-10-CM | POA: Diagnosis not present

## 2013-12-10 DIAGNOSIS — M1990 Unspecified osteoarthritis, unspecified site: Secondary | ICD-10-CM | POA: Diagnosis not present

## 2013-12-10 DIAGNOSIS — D509 Iron deficiency anemia, unspecified: Secondary | ICD-10-CM | POA: Diagnosis not present

## 2014-02-10 DIAGNOSIS — M791 Myalgia: Secondary | ICD-10-CM | POA: Diagnosis not present

## 2014-02-10 DIAGNOSIS — Z6831 Body mass index (BMI) 31.0-31.9, adult: Secondary | ICD-10-CM | POA: Diagnosis not present

## 2014-02-10 DIAGNOSIS — M898X9 Other specified disorders of bone, unspecified site: Secondary | ICD-10-CM | POA: Diagnosis not present

## 2014-02-10 DIAGNOSIS — N183 Chronic kidney disease, stage 3 (moderate): Secondary | ICD-10-CM | POA: Diagnosis not present

## 2014-02-10 DIAGNOSIS — Z2821 Immunization not carried out because of patient refusal: Secondary | ICD-10-CM | POA: Diagnosis not present

## 2014-02-10 DIAGNOSIS — N951 Menopausal and female climacteric states: Secondary | ICD-10-CM | POA: Diagnosis not present

## 2014-03-15 DIAGNOSIS — N183 Chronic kidney disease, stage 3 (moderate): Secondary | ICD-10-CM | POA: Diagnosis not present

## 2014-03-15 DIAGNOSIS — E785 Hyperlipidemia, unspecified: Secondary | ICD-10-CM | POA: Diagnosis not present

## 2014-03-15 DIAGNOSIS — I10 Essential (primary) hypertension: Secondary | ICD-10-CM | POA: Diagnosis not present

## 2014-03-15 DIAGNOSIS — R112 Nausea with vomiting, unspecified: Secondary | ICD-10-CM | POA: Diagnosis not present

## 2014-03-15 DIAGNOSIS — M797 Fibromyalgia: Secondary | ICD-10-CM | POA: Diagnosis not present

## 2014-03-15 DIAGNOSIS — Z6831 Body mass index (BMI) 31.0-31.9, adult: Secondary | ICD-10-CM | POA: Diagnosis not present

## 2014-03-15 DIAGNOSIS — E611 Iron deficiency: Secondary | ICD-10-CM | POA: Diagnosis not present

## 2014-03-15 DIAGNOSIS — D509 Iron deficiency anemia, unspecified: Secondary | ICD-10-CM | POA: Diagnosis not present

## 2014-04-19 DIAGNOSIS — R110 Nausea: Secondary | ICD-10-CM | POA: Diagnosis not present

## 2014-04-19 DIAGNOSIS — Z6831 Body mass index (BMI) 31.0-31.9, adult: Secondary | ICD-10-CM | POA: Diagnosis not present

## 2014-04-19 DIAGNOSIS — J208 Acute bronchitis due to other specified organisms: Secondary | ICD-10-CM | POA: Diagnosis not present

## 2014-06-21 DIAGNOSIS — I10 Essential (primary) hypertension: Secondary | ICD-10-CM | POA: Diagnosis not present

## 2014-06-21 DIAGNOSIS — E785 Hyperlipidemia, unspecified: Secondary | ICD-10-CM | POA: Diagnosis not present

## 2014-06-21 DIAGNOSIS — D509 Iron deficiency anemia, unspecified: Secondary | ICD-10-CM | POA: Diagnosis not present

## 2014-06-21 DIAGNOSIS — N183 Chronic kidney disease, stage 3 (moderate): Secondary | ICD-10-CM | POA: Diagnosis not present

## 2014-06-21 DIAGNOSIS — M797 Fibromyalgia: Secondary | ICD-10-CM | POA: Diagnosis not present

## 2014-06-21 DIAGNOSIS — E611 Iron deficiency: Secondary | ICD-10-CM | POA: Diagnosis not present

## 2014-06-21 DIAGNOSIS — Z1231 Encounter for screening mammogram for malignant neoplasm of breast: Secondary | ICD-10-CM | POA: Diagnosis not present

## 2014-06-21 DIAGNOSIS — Z6830 Body mass index (BMI) 30.0-30.9, adult: Secondary | ICD-10-CM | POA: Diagnosis not present

## 2014-09-23 DIAGNOSIS — Z6831 Body mass index (BMI) 31.0-31.9, adult: Secondary | ICD-10-CM | POA: Diagnosis not present

## 2014-09-23 DIAGNOSIS — Z1389 Encounter for screening for other disorder: Secondary | ICD-10-CM | POA: Diagnosis not present

## 2014-09-23 DIAGNOSIS — E611 Iron deficiency: Secondary | ICD-10-CM | POA: Diagnosis not present

## 2014-09-23 DIAGNOSIS — I10 Essential (primary) hypertension: Secondary | ICD-10-CM | POA: Diagnosis not present

## 2014-09-23 DIAGNOSIS — N183 Chronic kidney disease, stage 3 (moderate): Secondary | ICD-10-CM | POA: Diagnosis not present

## 2014-09-23 DIAGNOSIS — E785 Hyperlipidemia, unspecified: Secondary | ICD-10-CM | POA: Diagnosis not present

## 2014-09-23 DIAGNOSIS — M797 Fibromyalgia: Secondary | ICD-10-CM | POA: Diagnosis not present

## 2014-12-30 DIAGNOSIS — M797 Fibromyalgia: Secondary | ICD-10-CM | POA: Diagnosis not present

## 2014-12-30 DIAGNOSIS — Z6832 Body mass index (BMI) 32.0-32.9, adult: Secondary | ICD-10-CM | POA: Diagnosis not present

## 2014-12-30 DIAGNOSIS — E785 Hyperlipidemia, unspecified: Secondary | ICD-10-CM | POA: Diagnosis not present

## 2014-12-30 DIAGNOSIS — Z1389 Encounter for screening for other disorder: Secondary | ICD-10-CM | POA: Diagnosis not present

## 2014-12-30 DIAGNOSIS — I10 Essential (primary) hypertension: Secondary | ICD-10-CM | POA: Diagnosis not present

## 2014-12-30 DIAGNOSIS — N183 Chronic kidney disease, stage 3 (moderate): Secondary | ICD-10-CM | POA: Diagnosis not present

## 2015-04-12 DIAGNOSIS — M5442 Lumbago with sciatica, left side: Secondary | ICD-10-CM | POA: Diagnosis not present

## 2015-04-12 DIAGNOSIS — Z6831 Body mass index (BMI) 31.0-31.9, adult: Secondary | ICD-10-CM | POA: Diagnosis not present

## 2015-04-12 DIAGNOSIS — E785 Hyperlipidemia, unspecified: Secondary | ICD-10-CM | POA: Diagnosis not present

## 2015-04-12 DIAGNOSIS — M797 Fibromyalgia: Secondary | ICD-10-CM | POA: Diagnosis not present

## 2015-04-12 DIAGNOSIS — M8589 Other specified disorders of bone density and structure, multiple sites: Secondary | ICD-10-CM | POA: Diagnosis not present

## 2015-04-12 DIAGNOSIS — M47816 Spondylosis without myelopathy or radiculopathy, lumbar region: Secondary | ICD-10-CM | POA: Diagnosis not present

## 2015-04-12 DIAGNOSIS — I10 Essential (primary) hypertension: Secondary | ICD-10-CM | POA: Diagnosis not present

## 2015-04-12 DIAGNOSIS — N183 Chronic kidney disease, stage 3 (moderate): Secondary | ICD-10-CM | POA: Diagnosis not present

## 2015-04-12 DIAGNOSIS — M810 Age-related osteoporosis without current pathological fracture: Secondary | ICD-10-CM | POA: Diagnosis not present

## 2015-04-15 DIAGNOSIS — M8589 Other specified disorders of bone density and structure, multiple sites: Secondary | ICD-10-CM | POA: Diagnosis not present

## 2015-07-22 DIAGNOSIS — E785 Hyperlipidemia, unspecified: Secondary | ICD-10-CM | POA: Diagnosis not present

## 2015-07-22 DIAGNOSIS — Z6831 Body mass index (BMI) 31.0-31.9, adult: Secondary | ICD-10-CM | POA: Diagnosis not present

## 2015-07-22 DIAGNOSIS — M797 Fibromyalgia: Secondary | ICD-10-CM | POA: Diagnosis not present

## 2015-07-22 DIAGNOSIS — N183 Chronic kidney disease, stage 3 (moderate): Secondary | ICD-10-CM | POA: Diagnosis not present

## 2015-07-22 DIAGNOSIS — M810 Age-related osteoporosis without current pathological fracture: Secondary | ICD-10-CM | POA: Diagnosis not present

## 2015-07-22 DIAGNOSIS — G250 Essential tremor: Secondary | ICD-10-CM | POA: Diagnosis not present

## 2015-07-22 DIAGNOSIS — I10 Essential (primary) hypertension: Secondary | ICD-10-CM | POA: Diagnosis not present

## 2015-11-10 DIAGNOSIS — M810 Age-related osteoporosis without current pathological fracture: Secondary | ICD-10-CM | POA: Diagnosis not present

## 2015-11-10 DIAGNOSIS — E785 Hyperlipidemia, unspecified: Secondary | ICD-10-CM | POA: Diagnosis not present

## 2015-11-10 DIAGNOSIS — I129 Hypertensive chronic kidney disease with stage 1 through stage 4 chronic kidney disease, or unspecified chronic kidney disease: Secondary | ICD-10-CM | POA: Diagnosis not present

## 2015-11-10 DIAGNOSIS — Z6831 Body mass index (BMI) 31.0-31.9, adult: Secondary | ICD-10-CM | POA: Diagnosis not present

## 2015-11-10 DIAGNOSIS — E669 Obesity, unspecified: Secondary | ICD-10-CM | POA: Diagnosis not present

## 2015-11-10 DIAGNOSIS — Z9181 History of falling: Secondary | ICD-10-CM | POA: Diagnosis not present

## 2015-11-10 DIAGNOSIS — N183 Chronic kidney disease, stage 3 (moderate): Secondary | ICD-10-CM | POA: Diagnosis not present

## 2015-11-10 DIAGNOSIS — M797 Fibromyalgia: Secondary | ICD-10-CM | POA: Diagnosis not present

## 2015-11-10 DIAGNOSIS — M25512 Pain in left shoulder: Secondary | ICD-10-CM | POA: Diagnosis not present

## 2016-02-13 DIAGNOSIS — N183 Chronic kidney disease, stage 3 (moderate): Secondary | ICD-10-CM | POA: Diagnosis not present

## 2016-02-13 DIAGNOSIS — E785 Hyperlipidemia, unspecified: Secondary | ICD-10-CM | POA: Diagnosis not present

## 2016-02-13 DIAGNOSIS — M810 Age-related osteoporosis without current pathological fracture: Secondary | ICD-10-CM | POA: Diagnosis not present

## 2016-02-13 DIAGNOSIS — M5136 Other intervertebral disc degeneration, lumbar region: Secondary | ICD-10-CM | POA: Diagnosis not present

## 2016-02-13 DIAGNOSIS — M25512 Pain in left shoulder: Secondary | ICD-10-CM | POA: Diagnosis not present

## 2016-02-13 DIAGNOSIS — I129 Hypertensive chronic kidney disease with stage 1 through stage 4 chronic kidney disease, or unspecified chronic kidney disease: Secondary | ICD-10-CM | POA: Diagnosis not present

## 2016-02-13 DIAGNOSIS — Z6832 Body mass index (BMI) 32.0-32.9, adult: Secondary | ICD-10-CM | POA: Diagnosis not present

## 2016-02-13 DIAGNOSIS — Z1389 Encounter for screening for other disorder: Secondary | ICD-10-CM | POA: Diagnosis not present

## 2016-02-13 DIAGNOSIS — M797 Fibromyalgia: Secondary | ICD-10-CM | POA: Diagnosis not present

## 2016-04-09 DIAGNOSIS — J208 Acute bronchitis due to other specified organisms: Secondary | ICD-10-CM | POA: Diagnosis not present

## 2016-04-09 DIAGNOSIS — Z6830 Body mass index (BMI) 30.0-30.9, adult: Secondary | ICD-10-CM | POA: Diagnosis not present

## 2016-04-09 DIAGNOSIS — M5136 Other intervertebral disc degeneration, lumbar region: Secondary | ICD-10-CM | POA: Diagnosis not present

## 2016-04-25 DIAGNOSIS — J208 Acute bronchitis due to other specified organisms: Secondary | ICD-10-CM | POA: Diagnosis not present

## 2016-05-29 DIAGNOSIS — N183 Chronic kidney disease, stage 3 (moderate): Secondary | ICD-10-CM | POA: Diagnosis not present

## 2016-05-29 DIAGNOSIS — I129 Hypertensive chronic kidney disease with stage 1 through stage 4 chronic kidney disease, or unspecified chronic kidney disease: Secondary | ICD-10-CM | POA: Diagnosis not present

## 2016-05-29 DIAGNOSIS — M797 Fibromyalgia: Secondary | ICD-10-CM | POA: Diagnosis not present

## 2016-05-29 DIAGNOSIS — Z79899 Other long term (current) drug therapy: Secondary | ICD-10-CM | POA: Diagnosis not present

## 2016-05-29 DIAGNOSIS — M5136 Other intervertebral disc degeneration, lumbar region: Secondary | ICD-10-CM | POA: Diagnosis not present

## 2016-05-29 DIAGNOSIS — M810 Age-related osteoporosis without current pathological fracture: Secondary | ICD-10-CM | POA: Diagnosis not present

## 2016-05-29 DIAGNOSIS — Z6832 Body mass index (BMI) 32.0-32.9, adult: Secondary | ICD-10-CM | POA: Diagnosis not present

## 2016-05-29 DIAGNOSIS — E785 Hyperlipidemia, unspecified: Secondary | ICD-10-CM | POA: Diagnosis not present

## 2016-05-31 DIAGNOSIS — N183 Chronic kidney disease, stage 3 (moderate): Secondary | ICD-10-CM | POA: Diagnosis not present

## 2016-05-31 DIAGNOSIS — E785 Hyperlipidemia, unspecified: Secondary | ICD-10-CM | POA: Diagnosis not present

## 2016-08-09 DIAGNOSIS — S39011A Strain of muscle, fascia and tendon of abdomen, initial encounter: Secondary | ICD-10-CM | POA: Diagnosis not present

## 2016-08-09 DIAGNOSIS — M5136 Other intervertebral disc degeneration, lumbar region: Secondary | ICD-10-CM | POA: Diagnosis not present

## 2016-08-09 DIAGNOSIS — R413 Other amnesia: Secondary | ICD-10-CM | POA: Diagnosis not present

## 2016-08-09 DIAGNOSIS — Z6832 Body mass index (BMI) 32.0-32.9, adult: Secondary | ICD-10-CM | POA: Diagnosis not present

## 2016-08-09 DIAGNOSIS — R110 Nausea: Secondary | ICD-10-CM | POA: Diagnosis not present

## 2016-08-09 DIAGNOSIS — I129 Hypertensive chronic kidney disease with stage 1 through stage 4 chronic kidney disease, or unspecified chronic kidney disease: Secondary | ICD-10-CM | POA: Diagnosis not present

## 2016-09-24 DIAGNOSIS — M810 Age-related osteoporosis without current pathological fracture: Secondary | ICD-10-CM | POA: Diagnosis not present

## 2016-09-24 DIAGNOSIS — I129 Hypertensive chronic kidney disease with stage 1 through stage 4 chronic kidney disease, or unspecified chronic kidney disease: Secondary | ICD-10-CM | POA: Diagnosis not present

## 2016-09-24 DIAGNOSIS — E785 Hyperlipidemia, unspecified: Secondary | ICD-10-CM | POA: Diagnosis not present

## 2016-09-24 DIAGNOSIS — N183 Chronic kidney disease, stage 3 (moderate): Secondary | ICD-10-CM | POA: Diagnosis not present

## 2016-09-24 DIAGNOSIS — Z6832 Body mass index (BMI) 32.0-32.9, adult: Secondary | ICD-10-CM | POA: Diagnosis not present

## 2016-09-24 DIAGNOSIS — Z79891 Long term (current) use of opiate analgesic: Secondary | ICD-10-CM | POA: Diagnosis not present

## 2016-09-24 DIAGNOSIS — M797 Fibromyalgia: Secondary | ICD-10-CM | POA: Diagnosis not present

## 2016-09-24 DIAGNOSIS — M5136 Other intervertebral disc degeneration, lumbar region: Secondary | ICD-10-CM | POA: Diagnosis not present

## 2016-10-29 DIAGNOSIS — H269 Unspecified cataract: Secondary | ICD-10-CM | POA: Diagnosis not present

## 2016-10-29 DIAGNOSIS — Z6833 Body mass index (BMI) 33.0-33.9, adult: Secondary | ICD-10-CM | POA: Diagnosis not present

## 2016-10-29 DIAGNOSIS — Z9181 History of falling: Secondary | ICD-10-CM | POA: Diagnosis not present

## 2016-10-29 DIAGNOSIS — Z1389 Encounter for screening for other disorder: Secondary | ICD-10-CM | POA: Diagnosis not present

## 2016-10-29 DIAGNOSIS — Z0000 Encounter for general adult medical examination without abnormal findings: Secondary | ICD-10-CM | POA: Diagnosis not present

## 2016-10-29 DIAGNOSIS — E785 Hyperlipidemia, unspecified: Secondary | ICD-10-CM | POA: Diagnosis not present

## 2016-11-07 DIAGNOSIS — B880 Other acariasis: Secondary | ICD-10-CM | POA: Diagnosis not present

## 2016-11-07 DIAGNOSIS — Z6832 Body mass index (BMI) 32.0-32.9, adult: Secondary | ICD-10-CM | POA: Diagnosis not present

## 2016-12-12 DIAGNOSIS — M549 Dorsalgia, unspecified: Secondary | ICD-10-CM | POA: Diagnosis not present

## 2016-12-17 DIAGNOSIS — H25041 Posterior subcapsular polar age-related cataract, right eye: Secondary | ICD-10-CM | POA: Diagnosis not present

## 2016-12-20 DIAGNOSIS — E785 Hyperlipidemia, unspecified: Secondary | ICD-10-CM | POA: Diagnosis not present

## 2016-12-20 DIAGNOSIS — Z79891 Long term (current) use of opiate analgesic: Secondary | ICD-10-CM | POA: Diagnosis not present

## 2016-12-20 DIAGNOSIS — N183 Chronic kidney disease, stage 3 (moderate): Secondary | ICD-10-CM | POA: Diagnosis not present

## 2017-01-25 DIAGNOSIS — H25811 Combined forms of age-related cataract, right eye: Secondary | ICD-10-CM | POA: Diagnosis not present

## 2017-01-30 DIAGNOSIS — J208 Acute bronchitis due to other specified organisms: Secondary | ICD-10-CM | POA: Diagnosis not present

## 2017-01-30 DIAGNOSIS — Z6832 Body mass index (BMI) 32.0-32.9, adult: Secondary | ICD-10-CM | POA: Diagnosis not present

## 2017-02-05 DIAGNOSIS — I10 Essential (primary) hypertension: Secondary | ICD-10-CM | POA: Diagnosis not present

## 2017-02-05 DIAGNOSIS — H9191 Unspecified hearing loss, right ear: Secondary | ICD-10-CM | POA: Diagnosis not present

## 2017-02-05 DIAGNOSIS — H25811 Combined forms of age-related cataract, right eye: Secondary | ICD-10-CM | POA: Diagnosis not present

## 2017-02-05 DIAGNOSIS — H259 Unspecified age-related cataract: Secondary | ICD-10-CM | POA: Diagnosis not present

## 2017-02-05 DIAGNOSIS — Z87891 Personal history of nicotine dependence: Secondary | ICD-10-CM | POA: Diagnosis not present

## 2017-02-05 DIAGNOSIS — K219 Gastro-esophageal reflux disease without esophagitis: Secondary | ICD-10-CM | POA: Diagnosis not present

## 2017-02-05 DIAGNOSIS — E7800 Pure hypercholesterolemia, unspecified: Secondary | ICD-10-CM | POA: Diagnosis not present

## 2017-02-05 DIAGNOSIS — Z79899 Other long term (current) drug therapy: Secondary | ICD-10-CM | POA: Diagnosis not present

## 2017-02-26 DIAGNOSIS — Z01818 Encounter for other preprocedural examination: Secondary | ICD-10-CM | POA: Diagnosis not present

## 2017-02-26 DIAGNOSIS — H25812 Combined forms of age-related cataract, left eye: Secondary | ICD-10-CM | POA: Diagnosis not present

## 2017-03-08 DIAGNOSIS — H2512 Age-related nuclear cataract, left eye: Secondary | ICD-10-CM | POA: Diagnosis not present

## 2017-03-08 DIAGNOSIS — H25812 Combined forms of age-related cataract, left eye: Secondary | ICD-10-CM | POA: Diagnosis not present

## 2017-04-03 DIAGNOSIS — N183 Chronic kidney disease, stage 3 (moderate): Secondary | ICD-10-CM | POA: Diagnosis not present

## 2017-04-03 DIAGNOSIS — M810 Age-related osteoporosis without current pathological fracture: Secondary | ICD-10-CM | POA: Diagnosis not present

## 2017-04-03 DIAGNOSIS — I129 Hypertensive chronic kidney disease with stage 1 through stage 4 chronic kidney disease, or unspecified chronic kidney disease: Secondary | ICD-10-CM | POA: Diagnosis not present

## 2017-04-03 DIAGNOSIS — M8589 Other specified disorders of bone density and structure, multiple sites: Secondary | ICD-10-CM | POA: Diagnosis not present

## 2017-04-03 DIAGNOSIS — Z79891 Long term (current) use of opiate analgesic: Secondary | ICD-10-CM | POA: Diagnosis not present

## 2017-04-03 DIAGNOSIS — M5136 Other intervertebral disc degeneration, lumbar region: Secondary | ICD-10-CM | POA: Diagnosis not present

## 2017-04-03 DIAGNOSIS — R002 Palpitations: Secondary | ICD-10-CM | POA: Diagnosis not present

## 2017-04-03 DIAGNOSIS — E785 Hyperlipidemia, unspecified: Secondary | ICD-10-CM | POA: Diagnosis not present

## 2017-04-03 DIAGNOSIS — Z6831 Body mass index (BMI) 31.0-31.9, adult: Secondary | ICD-10-CM | POA: Diagnosis not present

## 2017-04-03 DIAGNOSIS — M797 Fibromyalgia: Secondary | ICD-10-CM | POA: Diagnosis not present

## 2017-04-10 DIAGNOSIS — H524 Presbyopia: Secondary | ICD-10-CM | POA: Diagnosis not present

## 2017-04-12 DIAGNOSIS — M8589 Other specified disorders of bone density and structure, multiple sites: Secondary | ICD-10-CM | POA: Diagnosis not present

## 2017-07-02 DIAGNOSIS — I129 Hypertensive chronic kidney disease with stage 1 through stage 4 chronic kidney disease, or unspecified chronic kidney disease: Secondary | ICD-10-CM | POA: Diagnosis not present

## 2017-07-02 DIAGNOSIS — M810 Age-related osteoporosis without current pathological fracture: Secondary | ICD-10-CM | POA: Diagnosis not present

## 2017-07-02 DIAGNOSIS — N183 Chronic kidney disease, stage 3 (moderate): Secondary | ICD-10-CM | POA: Diagnosis not present

## 2017-07-02 DIAGNOSIS — M5136 Other intervertebral disc degeneration, lumbar region: Secondary | ICD-10-CM | POA: Diagnosis not present

## 2017-07-02 DIAGNOSIS — Z6831 Body mass index (BMI) 31.0-31.9, adult: Secondary | ICD-10-CM | POA: Diagnosis not present

## 2017-07-02 DIAGNOSIS — Z79891 Long term (current) use of opiate analgesic: Secondary | ICD-10-CM | POA: Diagnosis not present

## 2017-07-02 DIAGNOSIS — M797 Fibromyalgia: Secondary | ICD-10-CM | POA: Diagnosis not present

## 2017-07-02 DIAGNOSIS — E785 Hyperlipidemia, unspecified: Secondary | ICD-10-CM | POA: Diagnosis not present

## 2017-10-09 DIAGNOSIS — E785 Hyperlipidemia, unspecified: Secondary | ICD-10-CM | POA: Diagnosis not present

## 2017-10-09 DIAGNOSIS — I129 Hypertensive chronic kidney disease with stage 1 through stage 4 chronic kidney disease, or unspecified chronic kidney disease: Secondary | ICD-10-CM | POA: Diagnosis not present

## 2017-10-09 DIAGNOSIS — M797 Fibromyalgia: Secondary | ICD-10-CM | POA: Diagnosis not present

## 2017-10-09 DIAGNOSIS — Z79891 Long term (current) use of opiate analgesic: Secondary | ICD-10-CM | POA: Diagnosis not present

## 2017-10-09 DIAGNOSIS — N183 Chronic kidney disease, stage 3 (moderate): Secondary | ICD-10-CM | POA: Diagnosis not present

## 2017-10-09 DIAGNOSIS — J329 Chronic sinusitis, unspecified: Secondary | ICD-10-CM | POA: Diagnosis not present

## 2017-11-11 DIAGNOSIS — J208 Acute bronchitis due to other specified organisms: Secondary | ICD-10-CM | POA: Diagnosis not present

## 2017-11-11 DIAGNOSIS — N951 Menopausal and female climacteric states: Secondary | ICD-10-CM | POA: Diagnosis not present

## 2017-12-02 DIAGNOSIS — I129 Hypertensive chronic kidney disease with stage 1 through stage 4 chronic kidney disease, or unspecified chronic kidney disease: Secondary | ICD-10-CM | POA: Diagnosis not present

## 2018-01-13 DIAGNOSIS — N183 Chronic kidney disease, stage 3 (moderate): Secondary | ICD-10-CM | POA: Diagnosis not present

## 2018-01-13 DIAGNOSIS — R413 Other amnesia: Secondary | ICD-10-CM | POA: Diagnosis not present

## 2018-01-13 DIAGNOSIS — E785 Hyperlipidemia, unspecified: Secondary | ICD-10-CM | POA: Diagnosis not present

## 2018-01-13 DIAGNOSIS — E538 Deficiency of other specified B group vitamins: Secondary | ICD-10-CM | POA: Diagnosis not present

## 2018-01-13 DIAGNOSIS — Z9181 History of falling: Secondary | ICD-10-CM | POA: Diagnosis not present

## 2018-01-13 DIAGNOSIS — I129 Hypertensive chronic kidney disease with stage 1 through stage 4 chronic kidney disease, or unspecified chronic kidney disease: Secondary | ICD-10-CM | POA: Diagnosis not present

## 2018-01-13 DIAGNOSIS — M797 Fibromyalgia: Secondary | ICD-10-CM | POA: Diagnosis not present

## 2018-01-13 DIAGNOSIS — J309 Allergic rhinitis, unspecified: Secondary | ICD-10-CM | POA: Diagnosis not present

## 2018-01-13 DIAGNOSIS — Z79891 Long term (current) use of opiate analgesic: Secondary | ICD-10-CM | POA: Diagnosis not present

## 2018-01-15 DIAGNOSIS — R413 Other amnesia: Secondary | ICD-10-CM | POA: Diagnosis not present

## 2018-02-12 ENCOUNTER — Other Ambulatory Visit: Payer: Self-pay

## 2018-04-21 DIAGNOSIS — M797 Fibromyalgia: Secondary | ICD-10-CM | POA: Diagnosis not present

## 2018-04-21 DIAGNOSIS — N183 Chronic kidney disease, stage 3 (moderate): Secondary | ICD-10-CM | POA: Diagnosis not present

## 2018-04-21 DIAGNOSIS — I129 Hypertensive chronic kidney disease with stage 1 through stage 4 chronic kidney disease, or unspecified chronic kidney disease: Secondary | ICD-10-CM | POA: Diagnosis not present

## 2018-04-21 DIAGNOSIS — E785 Hyperlipidemia, unspecified: Secondary | ICD-10-CM | POA: Diagnosis not present

## 2018-04-21 DIAGNOSIS — Z79891 Long term (current) use of opiate analgesic: Secondary | ICD-10-CM | POA: Diagnosis not present

## 2018-05-27 DIAGNOSIS — J302 Other seasonal allergic rhinitis: Secondary | ICD-10-CM | POA: Diagnosis not present

## 2018-05-27 DIAGNOSIS — N390 Urinary tract infection, site not specified: Secondary | ICD-10-CM | POA: Diagnosis not present

## 2018-05-27 DIAGNOSIS — Z6828 Body mass index (BMI) 28.0-28.9, adult: Secondary | ICD-10-CM | POA: Diagnosis not present

## 2018-09-19 DIAGNOSIS — M797 Fibromyalgia: Secondary | ICD-10-CM | POA: Diagnosis not present

## 2018-09-19 DIAGNOSIS — N183 Chronic kidney disease, stage 3 (moderate): Secondary | ICD-10-CM | POA: Diagnosis not present

## 2018-09-19 DIAGNOSIS — I129 Hypertensive chronic kidney disease with stage 1 through stage 4 chronic kidney disease, or unspecified chronic kidney disease: Secondary | ICD-10-CM | POA: Diagnosis not present

## 2018-09-19 DIAGNOSIS — E785 Hyperlipidemia, unspecified: Secondary | ICD-10-CM | POA: Diagnosis not present

## 2018-09-19 DIAGNOSIS — J309 Allergic rhinitis, unspecified: Secondary | ICD-10-CM | POA: Diagnosis not present

## 2018-09-19 DIAGNOSIS — Z79891 Long term (current) use of opiate analgesic: Secondary | ICD-10-CM | POA: Diagnosis not present

## 2018-11-13 ENCOUNTER — Encounter: Payer: Self-pay | Admitting: Gastroenterology

## 2018-12-19 DIAGNOSIS — E785 Hyperlipidemia, unspecified: Secondary | ICD-10-CM | POA: Diagnosis not present

## 2018-12-19 DIAGNOSIS — Z139 Encounter for screening, unspecified: Secondary | ICD-10-CM | POA: Diagnosis not present

## 2018-12-19 DIAGNOSIS — I129 Hypertensive chronic kidney disease with stage 1 through stage 4 chronic kidney disease, or unspecified chronic kidney disease: Secondary | ICD-10-CM | POA: Diagnosis not present

## 2018-12-19 DIAGNOSIS — N183 Chronic kidney disease, stage 3 (moderate): Secondary | ICD-10-CM | POA: Diagnosis not present

## 2018-12-19 DIAGNOSIS — Z79891 Long term (current) use of opiate analgesic: Secondary | ICD-10-CM | POA: Diagnosis not present

## 2018-12-19 DIAGNOSIS — M797 Fibromyalgia: Secondary | ICD-10-CM | POA: Diagnosis not present

## 2019-01-19 DIAGNOSIS — J342 Deviated nasal septum: Secondary | ICD-10-CM | POA: Diagnosis not present

## 2019-01-19 DIAGNOSIS — Z77122 Contact with and (suspected) exposure to noise: Secondary | ICD-10-CM | POA: Diagnosis not present

## 2019-01-19 DIAGNOSIS — H9319 Tinnitus, unspecified ear: Secondary | ICD-10-CM | POA: Diagnosis not present

## 2019-01-19 DIAGNOSIS — H9190 Unspecified hearing loss, unspecified ear: Secondary | ICD-10-CM | POA: Diagnosis not present

## 2019-01-19 DIAGNOSIS — H903 Sensorineural hearing loss, bilateral: Secondary | ICD-10-CM | POA: Diagnosis not present

## 2019-02-13 ENCOUNTER — Other Ambulatory Visit: Payer: Self-pay

## 2019-05-05 DIAGNOSIS — B354 Tinea corporis: Secondary | ICD-10-CM | POA: Diagnosis not present

## 2019-05-05 DIAGNOSIS — F0781 Postconcussional syndrome: Secondary | ICD-10-CM | POA: Diagnosis not present

## 2019-05-06 ENCOUNTER — Encounter: Payer: Self-pay | Admitting: Neurology

## 2019-05-07 DIAGNOSIS — E785 Hyperlipidemia, unspecified: Secondary | ICD-10-CM | POA: Diagnosis not present

## 2019-05-07 DIAGNOSIS — Z1231 Encounter for screening mammogram for malignant neoplasm of breast: Secondary | ICD-10-CM | POA: Diagnosis not present

## 2019-05-07 DIAGNOSIS — Z1331 Encounter for screening for depression: Secondary | ICD-10-CM | POA: Diagnosis not present

## 2019-05-07 DIAGNOSIS — Z9181 History of falling: Secondary | ICD-10-CM | POA: Diagnosis not present

## 2019-05-07 DIAGNOSIS — Z0000 Encounter for general adult medical examination without abnormal findings: Secondary | ICD-10-CM | POA: Diagnosis not present

## 2019-05-07 DIAGNOSIS — Z1211 Encounter for screening for malignant neoplasm of colon: Secondary | ICD-10-CM | POA: Diagnosis not present

## 2019-05-08 DIAGNOSIS — F0781 Postconcussional syndrome: Secondary | ICD-10-CM | POA: Diagnosis not present
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
## 2019-06-30 ENCOUNTER — Ambulatory Visit: Payer: Medicare Other | Admitting: Neurology

## 2019-07-07 DIAGNOSIS — Z79891 Long term (current) use of opiate analgesic: Secondary | ICD-10-CM | POA: Diagnosis not present

## 2019-07-07 DIAGNOSIS — M797 Fibromyalgia: Secondary | ICD-10-CM | POA: Diagnosis not present

## 2019-07-07 DIAGNOSIS — M5136 Other intervertebral disc degeneration, lumbar region: Secondary | ICD-10-CM | POA: Diagnosis not present

## 2019-07-07 DIAGNOSIS — I129 Hypertensive chronic kidney disease with stage 1 through stage 4 chronic kidney disease, or unspecified chronic kidney disease: Secondary | ICD-10-CM | POA: Diagnosis not present

## 2019-07-07 DIAGNOSIS — E785 Hyperlipidemia, unspecified: Secondary | ICD-10-CM | POA: Diagnosis not present

## 2019-07-07 DIAGNOSIS — N183 Chronic kidney disease, stage 3 unspecified: Secondary | ICD-10-CM | POA: Diagnosis not present

## 2019-07-08 DIAGNOSIS — M810 Age-related osteoporosis without current pathological fracture: Secondary | ICD-10-CM | POA: Diagnosis not present

## 2019-07-08 DIAGNOSIS — M8589 Other specified disorders of bone density and structure, multiple sites: Secondary | ICD-10-CM | POA: Diagnosis not present

## 2019-10-06 DIAGNOSIS — M810 Age-related osteoporosis without current pathological fracture: Secondary | ICD-10-CM | POA: Diagnosis not present

## 2019-10-06 DIAGNOSIS — E785 Hyperlipidemia, unspecified: Secondary | ICD-10-CM | POA: Diagnosis not present

## 2019-10-06 DIAGNOSIS — Z79891 Long term (current) use of opiate analgesic: Secondary | ICD-10-CM | POA: Diagnosis not present

## 2019-10-06 DIAGNOSIS — M5136 Other intervertebral disc degeneration, lumbar region: Secondary | ICD-10-CM | POA: Diagnosis not present

## 2019-10-06 DIAGNOSIS — I129 Hypertensive chronic kidney disease with stage 1 through stage 4 chronic kidney disease, or unspecified chronic kidney disease: Secondary | ICD-10-CM | POA: Diagnosis not present

## 2019-10-06 DIAGNOSIS — M797 Fibromyalgia: Secondary | ICD-10-CM | POA: Diagnosis not present

## 2019-10-06 DIAGNOSIS — Z79899 Other long term (current) drug therapy: Secondary | ICD-10-CM | POA: Diagnosis not present

## 2019-10-06 DIAGNOSIS — J309 Allergic rhinitis, unspecified: Secondary | ICD-10-CM | POA: Diagnosis not present

## 2019-10-06 DIAGNOSIS — F419 Anxiety disorder, unspecified: Secondary | ICD-10-CM | POA: Diagnosis not present

## 2019-10-06 DIAGNOSIS — R413 Other amnesia: Secondary | ICD-10-CM | POA: Diagnosis not present

## 2019-10-06 DIAGNOSIS — M8589 Other specified disorders of bone density and structure, multiple sites: Secondary | ICD-10-CM | POA: Diagnosis not present

## 2020-01-06 DIAGNOSIS — E785 Hyperlipidemia, unspecified: Secondary | ICD-10-CM | POA: Diagnosis not present

## 2020-01-06 DIAGNOSIS — Z79891 Long term (current) use of opiate analgesic: Secondary | ICD-10-CM | POA: Diagnosis not present

## 2020-01-06 DIAGNOSIS — M810 Age-related osteoporosis without current pathological fracture: Secondary | ICD-10-CM | POA: Diagnosis not present

## 2020-01-06 DIAGNOSIS — I129 Hypertensive chronic kidney disease with stage 1 through stage 4 chronic kidney disease, or unspecified chronic kidney disease: Secondary | ICD-10-CM | POA: Diagnosis not present

## 2020-01-06 DIAGNOSIS — M5136 Other intervertebral disc degeneration, lumbar region: Secondary | ICD-10-CM | POA: Diagnosis not present

## 2020-01-06 DIAGNOSIS — J309 Allergic rhinitis, unspecified: Secondary | ICD-10-CM | POA: Diagnosis not present

## 2020-01-06 DIAGNOSIS — N183 Chronic kidney disease, stage 3 unspecified: Secondary | ICD-10-CM | POA: Diagnosis not present

## 2020-01-06 DIAGNOSIS — M797 Fibromyalgia: Secondary | ICD-10-CM | POA: Diagnosis not present

## 2020-01-06 DIAGNOSIS — R413 Other amnesia: Secondary | ICD-10-CM | POA: Diagnosis not present

## 2020-05-24 DIAGNOSIS — R413 Other amnesia: Secondary | ICD-10-CM | POA: Diagnosis not present

## 2020-05-24 DIAGNOSIS — E785 Hyperlipidemia, unspecified: Secondary | ICD-10-CM | POA: Diagnosis not present

## 2020-05-24 DIAGNOSIS — Z5320 Procedure and treatment not carried out because of patient's decision for unspecified reasons: Secondary | ICD-10-CM | POA: Diagnosis not present

## 2020-05-24 DIAGNOSIS — J309 Allergic rhinitis, unspecified: Secondary | ICD-10-CM | POA: Diagnosis not present

## 2020-05-24 DIAGNOSIS — N183 Chronic kidney disease, stage 3 unspecified: Secondary | ICD-10-CM | POA: Diagnosis not present

## 2020-05-24 DIAGNOSIS — M810 Age-related osteoporosis without current pathological fracture: Secondary | ICD-10-CM | POA: Diagnosis not present

## 2020-05-24 DIAGNOSIS — I129 Hypertensive chronic kidney disease with stage 1 through stage 4 chronic kidney disease, or unspecified chronic kidney disease: Secondary | ICD-10-CM | POA: Diagnosis not present

## 2020-05-24 DIAGNOSIS — M5136 Other intervertebral disc degeneration, lumbar region: Secondary | ICD-10-CM | POA: Diagnosis not present

## 2020-05-24 DIAGNOSIS — M797 Fibromyalgia: Secondary | ICD-10-CM | POA: Diagnosis not present

## 2020-05-24 DIAGNOSIS — Z79891 Long term (current) use of opiate analgesic: Secondary | ICD-10-CM | POA: Diagnosis not present

## 2020-08-29 DIAGNOSIS — M5136 Other intervertebral disc degeneration, lumbar region: Secondary | ICD-10-CM | POA: Diagnosis not present

## 2020-08-29 DIAGNOSIS — I129 Hypertensive chronic kidney disease with stage 1 through stage 4 chronic kidney disease, or unspecified chronic kidney disease: Secondary | ICD-10-CM | POA: Diagnosis not present

## 2020-08-29 DIAGNOSIS — E785 Hyperlipidemia, unspecified: Secondary | ICD-10-CM | POA: Diagnosis not present

## 2020-08-29 DIAGNOSIS — R413 Other amnesia: Secondary | ICD-10-CM | POA: Diagnosis not present

## 2020-08-29 DIAGNOSIS — M797 Fibromyalgia: Secondary | ICD-10-CM | POA: Diagnosis not present

## 2020-08-29 DIAGNOSIS — Z79891 Long term (current) use of opiate analgesic: Secondary | ICD-10-CM | POA: Diagnosis not present

## 2020-08-29 DIAGNOSIS — N183 Chronic kidney disease, stage 3 unspecified: Secondary | ICD-10-CM | POA: Diagnosis not present

## 2020-08-29 DIAGNOSIS — M810 Age-related osteoporosis without current pathological fracture: Secondary | ICD-10-CM | POA: Diagnosis not present

## 2020-12-02 DIAGNOSIS — Z79899 Other long term (current) drug therapy: Secondary | ICD-10-CM | POA: Diagnosis not present

## 2020-12-02 DIAGNOSIS — R413 Other amnesia: Secondary | ICD-10-CM | POA: Diagnosis not present

## 2020-12-02 DIAGNOSIS — N183 Chronic kidney disease, stage 3 unspecified: Secondary | ICD-10-CM | POA: Diagnosis not present

## 2020-12-02 DIAGNOSIS — M8589 Other specified disorders of bone density and structure, multiple sites: Secondary | ICD-10-CM | POA: Diagnosis not present

## 2020-12-02 DIAGNOSIS — M810 Age-related osteoporosis without current pathological fracture: Secondary | ICD-10-CM | POA: Diagnosis not present

## 2020-12-02 DIAGNOSIS — M5136 Other intervertebral disc degeneration, lumbar region: Secondary | ICD-10-CM | POA: Diagnosis not present

## 2020-12-02 DIAGNOSIS — Z79891 Long term (current) use of opiate analgesic: Secondary | ICD-10-CM | POA: Diagnosis not present

## 2020-12-02 DIAGNOSIS — I129 Hypertensive chronic kidney disease with stage 1 through stage 4 chronic kidney disease, or unspecified chronic kidney disease: Secondary | ICD-10-CM | POA: Diagnosis not present

## 2020-12-02 DIAGNOSIS — E785 Hyperlipidemia, unspecified: Secondary | ICD-10-CM | POA: Diagnosis not present

## 2020-12-02 DIAGNOSIS — M797 Fibromyalgia: Secondary | ICD-10-CM | POA: Diagnosis not present

## 2021-06-01 DIAGNOSIS — R413 Other amnesia: Secondary | ICD-10-CM | POA: Diagnosis not present

## 2021-06-01 DIAGNOSIS — M810 Age-related osteoporosis without current pathological fracture: Secondary | ICD-10-CM | POA: Diagnosis not present

## 2021-06-01 DIAGNOSIS — M5136 Other intervertebral disc degeneration, lumbar region: Secondary | ICD-10-CM | POA: Diagnosis not present

## 2021-06-01 DIAGNOSIS — Z79891 Long term (current) use of opiate analgesic: Secondary | ICD-10-CM | POA: Diagnosis not present

## 2021-06-01 DIAGNOSIS — I129 Hypertensive chronic kidney disease with stage 1 through stage 4 chronic kidney disease, or unspecified chronic kidney disease: Secondary | ICD-10-CM | POA: Diagnosis not present

## 2021-06-01 DIAGNOSIS — M797 Fibromyalgia: Secondary | ICD-10-CM | POA: Diagnosis not present

## 2021-06-01 DIAGNOSIS — N183 Chronic kidney disease, stage 3 unspecified: Secondary | ICD-10-CM | POA: Diagnosis not present

## 2021-06-01 DIAGNOSIS — E785 Hyperlipidemia, unspecified: Secondary | ICD-10-CM | POA: Diagnosis not present

## 2021-12-05 DIAGNOSIS — G729 Myopathy, unspecified: Secondary | ICD-10-CM | POA: Diagnosis not present

## 2021-12-05 DIAGNOSIS — N183 Chronic kidney disease, stage 3 unspecified: Secondary | ICD-10-CM | POA: Diagnosis not present

## 2021-12-05 DIAGNOSIS — M5136 Other intervertebral disc degeneration, lumbar region: Secondary | ICD-10-CM | POA: Diagnosis not present

## 2021-12-05 DIAGNOSIS — Z79891 Long term (current) use of opiate analgesic: Secondary | ICD-10-CM | POA: Diagnosis not present

## 2021-12-05 DIAGNOSIS — M797 Fibromyalgia: Secondary | ICD-10-CM | POA: Diagnosis not present

## 2021-12-05 DIAGNOSIS — I129 Hypertensive chronic kidney disease with stage 1 through stage 4 chronic kidney disease, or unspecified chronic kidney disease: Secondary | ICD-10-CM | POA: Diagnosis not present

## 2021-12-05 DIAGNOSIS — R413 Other amnesia: Secondary | ICD-10-CM | POA: Diagnosis not present

## 2021-12-05 DIAGNOSIS — M810 Age-related osteoporosis without current pathological fracture: Secondary | ICD-10-CM | POA: Diagnosis not present

## 2021-12-05 DIAGNOSIS — E785 Hyperlipidemia, unspecified: Secondary | ICD-10-CM | POA: Diagnosis not present

## 2021-12-05 DIAGNOSIS — J309 Allergic rhinitis, unspecified: Secondary | ICD-10-CM | POA: Diagnosis not present

## 2022-05-28 DIAGNOSIS — R110 Nausea: Secondary | ICD-10-CM | POA: Diagnosis not present

## 2022-05-28 DIAGNOSIS — J208 Acute bronchitis due to other specified organisms: Secondary | ICD-10-CM | POA: Diagnosis not present

## 2022-05-28 DIAGNOSIS — R1013 Epigastric pain: Secondary | ICD-10-CM | POA: Diagnosis not present

## 2022-05-28 DIAGNOSIS — J309 Allergic rhinitis, unspecified: Secondary | ICD-10-CM | POA: Diagnosis not present

## 2022-05-28 DIAGNOSIS — F439 Reaction to severe stress, unspecified: Secondary | ICD-10-CM | POA: Diagnosis not present

## 2022-05-28 DIAGNOSIS — M5136 Other intervertebral disc degeneration, lumbar region: Secondary | ICD-10-CM | POA: Diagnosis not present

## 2022-06-05 DIAGNOSIS — Z139 Encounter for screening, unspecified: Secondary | ICD-10-CM | POA: Diagnosis not present

## 2022-06-05 DIAGNOSIS — R413 Other amnesia: Secondary | ICD-10-CM | POA: Diagnosis not present

## 2022-06-05 DIAGNOSIS — N183 Chronic kidney disease, stage 3 unspecified: Secondary | ICD-10-CM | POA: Diagnosis not present

## 2022-06-05 DIAGNOSIS — Z9181 History of falling: Secondary | ICD-10-CM | POA: Diagnosis not present

## 2022-06-05 DIAGNOSIS — M810 Age-related osteoporosis without current pathological fracture: Secondary | ICD-10-CM | POA: Diagnosis not present

## 2022-06-05 DIAGNOSIS — G729 Myopathy, unspecified: Secondary | ICD-10-CM | POA: Diagnosis not present

## 2022-06-05 DIAGNOSIS — M5136 Other intervertebral disc degeneration, lumbar region: Secondary | ICD-10-CM | POA: Diagnosis not present

## 2022-06-05 DIAGNOSIS — I129 Hypertensive chronic kidney disease with stage 1 through stage 4 chronic kidney disease, or unspecified chronic kidney disease: Secondary | ICD-10-CM | POA: Diagnosis not present

## 2022-06-05 DIAGNOSIS — E785 Hyperlipidemia, unspecified: Secondary | ICD-10-CM | POA: Diagnosis not present

## 2022-06-05 DIAGNOSIS — M797 Fibromyalgia: Secondary | ICD-10-CM | POA: Diagnosis not present

## 2022-06-05 DIAGNOSIS — Z79891 Long term (current) use of opiate analgesic: Secondary | ICD-10-CM | POA: Diagnosis not present

## 2022-06-21 DIAGNOSIS — D72829 Elevated white blood cell count, unspecified: Secondary | ICD-10-CM | POA: Diagnosis not present

## 2022-07-18 DIAGNOSIS — B9689 Other specified bacterial agents as the cause of diseases classified elsewhere: Secondary | ICD-10-CM | POA: Diagnosis not present

## 2022-07-18 DIAGNOSIS — M5136 Other intervertebral disc degeneration, lumbar region: Secondary | ICD-10-CM | POA: Diagnosis not present

## 2022-07-18 DIAGNOSIS — J208 Acute bronchitis due to other specified organisms: Secondary | ICD-10-CM | POA: Diagnosis not present

## 2022-07-23 DIAGNOSIS — B9689 Other specified bacterial agents as the cause of diseases classified elsewhere: Secondary | ICD-10-CM | POA: Diagnosis not present

## 2022-07-23 DIAGNOSIS — R110 Nausea: Secondary | ICD-10-CM | POA: Diagnosis not present

## 2022-07-23 DIAGNOSIS — J208 Acute bronchitis due to other specified organisms: Secondary | ICD-10-CM | POA: Diagnosis not present

## 2022-12-06 DIAGNOSIS — N183 Chronic kidney disease, stage 3 unspecified: Secondary | ICD-10-CM | POA: Diagnosis not present

## 2022-12-06 DIAGNOSIS — M810 Age-related osteoporosis without current pathological fracture: Secondary | ICD-10-CM | POA: Diagnosis not present

## 2022-12-06 DIAGNOSIS — M5136 Other intervertebral disc degeneration, lumbar region: Secondary | ICD-10-CM | POA: Diagnosis not present

## 2022-12-06 DIAGNOSIS — M797 Fibromyalgia: Secondary | ICD-10-CM | POA: Diagnosis not present

## 2022-12-06 DIAGNOSIS — J208 Acute bronchitis due to other specified organisms: Secondary | ICD-10-CM | POA: Diagnosis not present

## 2022-12-06 DIAGNOSIS — E785 Hyperlipidemia, unspecified: Secondary | ICD-10-CM | POA: Diagnosis not present

## 2022-12-06 DIAGNOSIS — Z79891 Long term (current) use of opiate analgesic: Secondary | ICD-10-CM | POA: Diagnosis not present

## 2022-12-06 DIAGNOSIS — B9689 Other specified bacterial agents as the cause of diseases classified elsewhere: Secondary | ICD-10-CM | POA: Diagnosis not present

## 2022-12-06 DIAGNOSIS — I129 Hypertensive chronic kidney disease with stage 1 through stage 4 chronic kidney disease, or unspecified chronic kidney disease: Secondary | ICD-10-CM | POA: Diagnosis not present

## 2022-12-06 DIAGNOSIS — R413 Other amnesia: Secondary | ICD-10-CM | POA: Diagnosis not present

## 2022-12-06 DIAGNOSIS — J309 Allergic rhinitis, unspecified: Secondary | ICD-10-CM | POA: Diagnosis not present

## 2023-02-10 DIAGNOSIS — Z5181 Encounter for therapeutic drug level monitoring: Secondary | ICD-10-CM | POA: Diagnosis not present

## 2023-02-10 DIAGNOSIS — R413 Other amnesia: Secondary | ICD-10-CM | POA: Diagnosis not present

## 2023-02-10 DIAGNOSIS — Z880 Allergy status to penicillin: Secondary | ICD-10-CM | POA: Diagnosis not present

## 2023-02-10 DIAGNOSIS — S81801A Unspecified open wound, right lower leg, initial encounter: Secondary | ICD-10-CM | POA: Diagnosis not present

## 2023-02-10 DIAGNOSIS — S81811A Laceration without foreign body, right lower leg, initial encounter: Secondary | ICD-10-CM | POA: Diagnosis not present

## 2023-02-10 DIAGNOSIS — I10 Essential (primary) hypertension: Secondary | ICD-10-CM | POA: Diagnosis not present

## 2023-02-10 DIAGNOSIS — Z981 Arthrodesis status: Secondary | ICD-10-CM | POA: Diagnosis not present

## 2023-02-10 DIAGNOSIS — K219 Gastro-esophageal reflux disease without esophagitis: Secondary | ICD-10-CM | POA: Diagnosis not present

## 2023-02-10 DIAGNOSIS — M542 Cervicalgia: Secondary | ICD-10-CM | POA: Diagnosis not present

## 2023-02-10 DIAGNOSIS — R29898 Other symptoms and signs involving the musculoskeletal system: Secondary | ICD-10-CM | POA: Diagnosis not present

## 2023-02-10 DIAGNOSIS — R531 Weakness: Secondary | ICD-10-CM | POA: Diagnosis not present

## 2023-02-10 DIAGNOSIS — G8929 Other chronic pain: Secondary | ICD-10-CM | POA: Diagnosis not present

## 2023-02-13 DIAGNOSIS — G8929 Other chronic pain: Secondary | ICD-10-CM | POA: Diagnosis not present

## 2023-02-13 DIAGNOSIS — E039 Hypothyroidism, unspecified: Secondary | ICD-10-CM | POA: Diagnosis not present

## 2023-02-13 DIAGNOSIS — S81811D Laceration without foreign body, right lower leg, subsequent encounter: Secondary | ICD-10-CM | POA: Diagnosis not present

## 2023-02-13 DIAGNOSIS — I700 Atherosclerosis of aorta: Secondary | ICD-10-CM | POA: Diagnosis not present

## 2023-02-13 DIAGNOSIS — M542 Cervicalgia: Secondary | ICD-10-CM | POA: Diagnosis not present

## 2023-02-13 DIAGNOSIS — F0280 Dementia in other diseases classified elsewhere without behavioral disturbance: Secondary | ICD-10-CM | POA: Diagnosis not present

## 2023-02-13 DIAGNOSIS — M797 Fibromyalgia: Secondary | ICD-10-CM | POA: Diagnosis not present

## 2023-02-13 DIAGNOSIS — I10 Essential (primary) hypertension: Secondary | ICD-10-CM | POA: Diagnosis not present

## 2023-02-13 DIAGNOSIS — I499 Cardiac arrhythmia, unspecified: Secondary | ICD-10-CM | POA: Diagnosis not present

## 2023-02-15 DIAGNOSIS — F0280 Dementia in other diseases classified elsewhere without behavioral disturbance: Secondary | ICD-10-CM | POA: Diagnosis not present

## 2023-02-15 DIAGNOSIS — S81811D Laceration without foreign body, right lower leg, subsequent encounter: Secondary | ICD-10-CM | POA: Diagnosis not present

## 2023-02-15 DIAGNOSIS — I10 Essential (primary) hypertension: Secondary | ICD-10-CM | POA: Diagnosis not present

## 2023-02-15 DIAGNOSIS — M797 Fibromyalgia: Secondary | ICD-10-CM | POA: Diagnosis not present

## 2023-02-15 DIAGNOSIS — M542 Cervicalgia: Secondary | ICD-10-CM | POA: Diagnosis not present

## 2023-02-15 DIAGNOSIS — I700 Atherosclerosis of aorta: Secondary | ICD-10-CM | POA: Diagnosis not present

## 2023-02-15 DIAGNOSIS — E039 Hypothyroidism, unspecified: Secondary | ICD-10-CM | POA: Diagnosis not present

## 2023-02-15 DIAGNOSIS — G8929 Other chronic pain: Secondary | ICD-10-CM | POA: Diagnosis not present

## 2023-02-15 DIAGNOSIS — I499 Cardiac arrhythmia, unspecified: Secondary | ICD-10-CM | POA: Diagnosis not present

## 2023-02-19 DIAGNOSIS — E039 Hypothyroidism, unspecified: Secondary | ICD-10-CM | POA: Diagnosis not present

## 2023-02-19 DIAGNOSIS — I700 Atherosclerosis of aorta: Secondary | ICD-10-CM | POA: Diagnosis not present

## 2023-02-19 DIAGNOSIS — M542 Cervicalgia: Secondary | ICD-10-CM | POA: Diagnosis not present

## 2023-02-19 DIAGNOSIS — S81811D Laceration without foreign body, right lower leg, subsequent encounter: Secondary | ICD-10-CM | POA: Diagnosis not present

## 2023-02-19 DIAGNOSIS — I10 Essential (primary) hypertension: Secondary | ICD-10-CM | POA: Diagnosis not present

## 2023-02-19 DIAGNOSIS — M797 Fibromyalgia: Secondary | ICD-10-CM | POA: Diagnosis not present

## 2023-02-19 DIAGNOSIS — F0280 Dementia in other diseases classified elsewhere without behavioral disturbance: Secondary | ICD-10-CM | POA: Diagnosis not present

## 2023-02-19 DIAGNOSIS — I499 Cardiac arrhythmia, unspecified: Secondary | ICD-10-CM | POA: Diagnosis not present

## 2023-02-19 DIAGNOSIS — G8929 Other chronic pain: Secondary | ICD-10-CM | POA: Diagnosis not present

## 2023-02-22 DIAGNOSIS — M542 Cervicalgia: Secondary | ICD-10-CM | POA: Diagnosis not present

## 2023-02-22 DIAGNOSIS — I700 Atherosclerosis of aorta: Secondary | ICD-10-CM | POA: Diagnosis not present

## 2023-02-22 DIAGNOSIS — F0280 Dementia in other diseases classified elsewhere without behavioral disturbance: Secondary | ICD-10-CM | POA: Diagnosis not present

## 2023-02-22 DIAGNOSIS — M797 Fibromyalgia: Secondary | ICD-10-CM | POA: Diagnosis not present

## 2023-02-22 DIAGNOSIS — E039 Hypothyroidism, unspecified: Secondary | ICD-10-CM | POA: Diagnosis not present

## 2023-02-22 DIAGNOSIS — I10 Essential (primary) hypertension: Secondary | ICD-10-CM | POA: Diagnosis not present

## 2023-02-22 DIAGNOSIS — G8929 Other chronic pain: Secondary | ICD-10-CM | POA: Diagnosis not present

## 2023-02-22 DIAGNOSIS — S81811D Laceration without foreign body, right lower leg, subsequent encounter: Secondary | ICD-10-CM | POA: Diagnosis not present

## 2023-02-22 DIAGNOSIS — I499 Cardiac arrhythmia, unspecified: Secondary | ICD-10-CM | POA: Diagnosis not present

## 2023-02-26 DIAGNOSIS — S81811D Laceration without foreign body, right lower leg, subsequent encounter: Secondary | ICD-10-CM | POA: Diagnosis not present

## 2023-02-26 DIAGNOSIS — M542 Cervicalgia: Secondary | ICD-10-CM | POA: Diagnosis not present

## 2023-02-26 DIAGNOSIS — G8929 Other chronic pain: Secondary | ICD-10-CM | POA: Diagnosis not present

## 2023-02-26 DIAGNOSIS — F0280 Dementia in other diseases classified elsewhere without behavioral disturbance: Secondary | ICD-10-CM | POA: Diagnosis not present

## 2023-02-26 DIAGNOSIS — I499 Cardiac arrhythmia, unspecified: Secondary | ICD-10-CM | POA: Diagnosis not present

## 2023-02-26 DIAGNOSIS — I700 Atherosclerosis of aorta: Secondary | ICD-10-CM | POA: Diagnosis not present

## 2023-02-26 DIAGNOSIS — E039 Hypothyroidism, unspecified: Secondary | ICD-10-CM | POA: Diagnosis not present

## 2023-02-26 DIAGNOSIS — M797 Fibromyalgia: Secondary | ICD-10-CM | POA: Diagnosis not present

## 2023-02-26 DIAGNOSIS — I10 Essential (primary) hypertension: Secondary | ICD-10-CM | POA: Diagnosis not present

## 2023-03-01 DIAGNOSIS — S81811D Laceration without foreign body, right lower leg, subsequent encounter: Secondary | ICD-10-CM | POA: Diagnosis not present

## 2023-03-01 DIAGNOSIS — I700 Atherosclerosis of aorta: Secondary | ICD-10-CM | POA: Diagnosis not present

## 2023-03-01 DIAGNOSIS — E039 Hypothyroidism, unspecified: Secondary | ICD-10-CM | POA: Diagnosis not present

## 2023-03-01 DIAGNOSIS — M797 Fibromyalgia: Secondary | ICD-10-CM | POA: Diagnosis not present

## 2023-03-01 DIAGNOSIS — I10 Essential (primary) hypertension: Secondary | ICD-10-CM | POA: Diagnosis not present

## 2023-03-01 DIAGNOSIS — G8929 Other chronic pain: Secondary | ICD-10-CM | POA: Diagnosis not present

## 2023-03-01 DIAGNOSIS — M542 Cervicalgia: Secondary | ICD-10-CM | POA: Diagnosis not present

## 2023-03-01 DIAGNOSIS — F0280 Dementia in other diseases classified elsewhere without behavioral disturbance: Secondary | ICD-10-CM | POA: Diagnosis not present

## 2023-03-01 DIAGNOSIS — I499 Cardiac arrhythmia, unspecified: Secondary | ICD-10-CM | POA: Diagnosis not present

## 2023-03-02 DIAGNOSIS — T148XXA Other injury of unspecified body region, initial encounter: Secondary | ICD-10-CM | POA: Diagnosis not present

## 2023-03-05 DIAGNOSIS — M797 Fibromyalgia: Secondary | ICD-10-CM | POA: Diagnosis not present

## 2023-03-05 DIAGNOSIS — I10 Essential (primary) hypertension: Secondary | ICD-10-CM | POA: Diagnosis not present

## 2023-03-05 DIAGNOSIS — F0280 Dementia in other diseases classified elsewhere without behavioral disturbance: Secondary | ICD-10-CM | POA: Diagnosis not present

## 2023-03-05 DIAGNOSIS — M542 Cervicalgia: Secondary | ICD-10-CM | POA: Diagnosis not present

## 2023-03-05 DIAGNOSIS — E039 Hypothyroidism, unspecified: Secondary | ICD-10-CM | POA: Diagnosis not present

## 2023-03-05 DIAGNOSIS — I499 Cardiac arrhythmia, unspecified: Secondary | ICD-10-CM | POA: Diagnosis not present

## 2023-03-05 DIAGNOSIS — G8929 Other chronic pain: Secondary | ICD-10-CM | POA: Diagnosis not present

## 2023-03-05 DIAGNOSIS — S81811D Laceration without foreign body, right lower leg, subsequent encounter: Secondary | ICD-10-CM | POA: Diagnosis not present

## 2023-03-05 DIAGNOSIS — I700 Atherosclerosis of aorta: Secondary | ICD-10-CM | POA: Diagnosis not present

## 2023-03-06 DIAGNOSIS — S81811D Laceration without foreign body, right lower leg, subsequent encounter: Secondary | ICD-10-CM | POA: Diagnosis not present

## 2023-03-06 DIAGNOSIS — I700 Atherosclerosis of aorta: Secondary | ICD-10-CM | POA: Diagnosis not present

## 2023-03-06 DIAGNOSIS — M542 Cervicalgia: Secondary | ICD-10-CM | POA: Diagnosis not present

## 2023-03-06 DIAGNOSIS — M797 Fibromyalgia: Secondary | ICD-10-CM | POA: Diagnosis not present

## 2023-03-06 DIAGNOSIS — F0280 Dementia in other diseases classified elsewhere without behavioral disturbance: Secondary | ICD-10-CM | POA: Diagnosis not present

## 2023-03-06 DIAGNOSIS — I10 Essential (primary) hypertension: Secondary | ICD-10-CM | POA: Diagnosis not present

## 2023-03-06 DIAGNOSIS — G8929 Other chronic pain: Secondary | ICD-10-CM | POA: Diagnosis not present

## 2023-03-06 DIAGNOSIS — I499 Cardiac arrhythmia, unspecified: Secondary | ICD-10-CM | POA: Diagnosis not present

## 2023-03-06 DIAGNOSIS — E039 Hypothyroidism, unspecified: Secondary | ICD-10-CM | POA: Diagnosis not present

## 2023-03-08 DIAGNOSIS — M542 Cervicalgia: Secondary | ICD-10-CM | POA: Diagnosis not present

## 2023-03-08 DIAGNOSIS — I700 Atherosclerosis of aorta: Secondary | ICD-10-CM | POA: Diagnosis not present

## 2023-03-08 DIAGNOSIS — I10 Essential (primary) hypertension: Secondary | ICD-10-CM | POA: Diagnosis not present

## 2023-03-08 DIAGNOSIS — M797 Fibromyalgia: Secondary | ICD-10-CM | POA: Diagnosis not present

## 2023-03-08 DIAGNOSIS — G8929 Other chronic pain: Secondary | ICD-10-CM | POA: Diagnosis not present

## 2023-03-08 DIAGNOSIS — E039 Hypothyroidism, unspecified: Secondary | ICD-10-CM | POA: Diagnosis not present

## 2023-03-08 DIAGNOSIS — F0280 Dementia in other diseases classified elsewhere without behavioral disturbance: Secondary | ICD-10-CM | POA: Diagnosis not present

## 2023-03-08 DIAGNOSIS — I499 Cardiac arrhythmia, unspecified: Secondary | ICD-10-CM | POA: Diagnosis not present

## 2023-03-08 DIAGNOSIS — S81811D Laceration without foreign body, right lower leg, subsequent encounter: Secondary | ICD-10-CM | POA: Diagnosis not present

## 2023-03-11 DIAGNOSIS — Z79891 Long term (current) use of opiate analgesic: Secondary | ICD-10-CM | POA: Diagnosis not present

## 2023-03-11 DIAGNOSIS — N183 Chronic kidney disease, stage 3 unspecified: Secondary | ICD-10-CM | POA: Diagnosis not present

## 2023-03-11 DIAGNOSIS — E785 Hyperlipidemia, unspecified: Secondary | ICD-10-CM | POA: Diagnosis not present

## 2023-03-11 DIAGNOSIS — M797 Fibromyalgia: Secondary | ICD-10-CM | POA: Diagnosis not present

## 2023-03-11 DIAGNOSIS — M810 Age-related osteoporosis without current pathological fracture: Secondary | ICD-10-CM | POA: Diagnosis not present

## 2023-03-11 DIAGNOSIS — R413 Other amnesia: Secondary | ICD-10-CM | POA: Diagnosis not present

## 2023-03-11 DIAGNOSIS — I129 Hypertensive chronic kidney disease with stage 1 through stage 4 chronic kidney disease, or unspecified chronic kidney disease: Secondary | ICD-10-CM | POA: Diagnosis not present

## 2023-03-12 DIAGNOSIS — I10 Essential (primary) hypertension: Secondary | ICD-10-CM | POA: Diagnosis not present

## 2023-03-12 DIAGNOSIS — F0280 Dementia in other diseases classified elsewhere without behavioral disturbance: Secondary | ICD-10-CM | POA: Diagnosis not present

## 2023-03-12 DIAGNOSIS — E039 Hypothyroidism, unspecified: Secondary | ICD-10-CM | POA: Diagnosis not present

## 2023-03-12 DIAGNOSIS — G8929 Other chronic pain: Secondary | ICD-10-CM | POA: Diagnosis not present

## 2023-03-12 DIAGNOSIS — S81811D Laceration without foreign body, right lower leg, subsequent encounter: Secondary | ICD-10-CM | POA: Diagnosis not present

## 2023-03-12 DIAGNOSIS — M542 Cervicalgia: Secondary | ICD-10-CM | POA: Diagnosis not present

## 2023-03-12 DIAGNOSIS — I499 Cardiac arrhythmia, unspecified: Secondary | ICD-10-CM | POA: Diagnosis not present

## 2023-03-12 DIAGNOSIS — M797 Fibromyalgia: Secondary | ICD-10-CM | POA: Diagnosis not present

## 2023-03-12 DIAGNOSIS — I700 Atherosclerosis of aorta: Secondary | ICD-10-CM | POA: Diagnosis not present

## 2023-03-22 DIAGNOSIS — M797 Fibromyalgia: Secondary | ICD-10-CM | POA: Diagnosis not present

## 2023-03-22 DIAGNOSIS — E039 Hypothyroidism, unspecified: Secondary | ICD-10-CM | POA: Diagnosis not present

## 2023-03-22 DIAGNOSIS — G8929 Other chronic pain: Secondary | ICD-10-CM | POA: Diagnosis not present

## 2023-03-22 DIAGNOSIS — I10 Essential (primary) hypertension: Secondary | ICD-10-CM | POA: Diagnosis not present

## 2023-03-22 DIAGNOSIS — S81811D Laceration without foreign body, right lower leg, subsequent encounter: Secondary | ICD-10-CM | POA: Diagnosis not present

## 2023-03-22 DIAGNOSIS — I499 Cardiac arrhythmia, unspecified: Secondary | ICD-10-CM | POA: Diagnosis not present

## 2023-03-22 DIAGNOSIS — I700 Atherosclerosis of aorta: Secondary | ICD-10-CM | POA: Diagnosis not present

## 2023-03-22 DIAGNOSIS — F0280 Dementia in other diseases classified elsewhere without behavioral disturbance: Secondary | ICD-10-CM | POA: Diagnosis not present

## 2023-03-22 DIAGNOSIS — M542 Cervicalgia: Secondary | ICD-10-CM | POA: Diagnosis not present

## 2023-03-26 DIAGNOSIS — F0280 Dementia in other diseases classified elsewhere without behavioral disturbance: Secondary | ICD-10-CM | POA: Diagnosis not present

## 2023-03-26 DIAGNOSIS — I499 Cardiac arrhythmia, unspecified: Secondary | ICD-10-CM | POA: Diagnosis not present

## 2023-03-26 DIAGNOSIS — I10 Essential (primary) hypertension: Secondary | ICD-10-CM | POA: Diagnosis not present

## 2023-03-26 DIAGNOSIS — G8929 Other chronic pain: Secondary | ICD-10-CM | POA: Diagnosis not present

## 2023-03-26 DIAGNOSIS — M542 Cervicalgia: Secondary | ICD-10-CM | POA: Diagnosis not present

## 2023-03-26 DIAGNOSIS — M797 Fibromyalgia: Secondary | ICD-10-CM | POA: Diagnosis not present

## 2023-03-26 DIAGNOSIS — S81811D Laceration without foreign body, right lower leg, subsequent encounter: Secondary | ICD-10-CM | POA: Diagnosis not present

## 2023-03-26 DIAGNOSIS — I700 Atherosclerosis of aorta: Secondary | ICD-10-CM | POA: Diagnosis not present

## 2023-03-26 DIAGNOSIS — E039 Hypothyroidism, unspecified: Secondary | ICD-10-CM | POA: Diagnosis not present

## 2023-04-09 DIAGNOSIS — E039 Hypothyroidism, unspecified: Secondary | ICD-10-CM | POA: Diagnosis not present

## 2023-04-09 DIAGNOSIS — G8929 Other chronic pain: Secondary | ICD-10-CM | POA: Diagnosis not present

## 2023-04-09 DIAGNOSIS — I700 Atherosclerosis of aorta: Secondary | ICD-10-CM | POA: Diagnosis not present

## 2023-04-09 DIAGNOSIS — F0280 Dementia in other diseases classified elsewhere without behavioral disturbance: Secondary | ICD-10-CM | POA: Diagnosis not present

## 2023-04-09 DIAGNOSIS — S81811D Laceration without foreign body, right lower leg, subsequent encounter: Secondary | ICD-10-CM | POA: Diagnosis not present

## 2023-04-09 DIAGNOSIS — I499 Cardiac arrhythmia, unspecified: Secondary | ICD-10-CM | POA: Diagnosis not present

## 2023-04-09 DIAGNOSIS — M797 Fibromyalgia: Secondary | ICD-10-CM | POA: Diagnosis not present

## 2023-04-09 DIAGNOSIS — M542 Cervicalgia: Secondary | ICD-10-CM | POA: Diagnosis not present

## 2023-04-09 DIAGNOSIS — I10 Essential (primary) hypertension: Secondary | ICD-10-CM | POA: Diagnosis not present

## 2023-04-18 DIAGNOSIS — I499 Cardiac arrhythmia, unspecified: Secondary | ICD-10-CM | POA: Diagnosis not present

## 2023-04-18 DIAGNOSIS — M797 Fibromyalgia: Secondary | ICD-10-CM | POA: Diagnosis not present

## 2023-04-18 DIAGNOSIS — F0280 Dementia in other diseases classified elsewhere without behavioral disturbance: Secondary | ICD-10-CM | POA: Diagnosis not present

## 2023-04-18 DIAGNOSIS — E039 Hypothyroidism, unspecified: Secondary | ICD-10-CM | POA: Diagnosis not present

## 2023-04-18 DIAGNOSIS — I700 Atherosclerosis of aorta: Secondary | ICD-10-CM | POA: Diagnosis not present

## 2023-04-18 DIAGNOSIS — S81811D Laceration without foreign body, right lower leg, subsequent encounter: Secondary | ICD-10-CM | POA: Diagnosis not present

## 2023-04-18 DIAGNOSIS — I10 Essential (primary) hypertension: Secondary | ICD-10-CM | POA: Diagnosis not present

## 2023-04-18 DIAGNOSIS — M542 Cervicalgia: Secondary | ICD-10-CM | POA: Diagnosis not present

## 2023-04-18 DIAGNOSIS — G8929 Other chronic pain: Secondary | ICD-10-CM | POA: Diagnosis not present

## 2023-04-23 DIAGNOSIS — S81811D Laceration without foreign body, right lower leg, subsequent encounter: Secondary | ICD-10-CM | POA: Diagnosis not present

## 2023-04-23 DIAGNOSIS — I10 Essential (primary) hypertension: Secondary | ICD-10-CM | POA: Diagnosis not present

## 2023-04-23 DIAGNOSIS — I700 Atherosclerosis of aorta: Secondary | ICD-10-CM | POA: Diagnosis not present

## 2023-04-23 DIAGNOSIS — I499 Cardiac arrhythmia, unspecified: Secondary | ICD-10-CM | POA: Diagnosis not present

## 2023-04-23 DIAGNOSIS — E039 Hypothyroidism, unspecified: Secondary | ICD-10-CM | POA: Diagnosis not present

## 2023-04-23 DIAGNOSIS — M542 Cervicalgia: Secondary | ICD-10-CM | POA: Diagnosis not present

## 2023-04-23 DIAGNOSIS — F0280 Dementia in other diseases classified elsewhere without behavioral disturbance: Secondary | ICD-10-CM | POA: Diagnosis not present

## 2023-04-23 DIAGNOSIS — M797 Fibromyalgia: Secondary | ICD-10-CM | POA: Diagnosis not present

## 2023-04-23 DIAGNOSIS — G8929 Other chronic pain: Secondary | ICD-10-CM | POA: Diagnosis not present

## 2023-05-01 DIAGNOSIS — I10 Essential (primary) hypertension: Secondary | ICD-10-CM | POA: Diagnosis not present

## 2023-05-01 DIAGNOSIS — S81811D Laceration without foreign body, right lower leg, subsequent encounter: Secondary | ICD-10-CM | POA: Diagnosis not present

## 2023-05-01 DIAGNOSIS — E039 Hypothyroidism, unspecified: Secondary | ICD-10-CM | POA: Diagnosis not present

## 2023-05-01 DIAGNOSIS — G8929 Other chronic pain: Secondary | ICD-10-CM | POA: Diagnosis not present

## 2023-05-01 DIAGNOSIS — M797 Fibromyalgia: Secondary | ICD-10-CM | POA: Diagnosis not present

## 2023-05-01 DIAGNOSIS — F0280 Dementia in other diseases classified elsewhere without behavioral disturbance: Secondary | ICD-10-CM | POA: Diagnosis not present

## 2023-05-01 DIAGNOSIS — I700 Atherosclerosis of aorta: Secondary | ICD-10-CM | POA: Diagnosis not present

## 2023-05-01 DIAGNOSIS — I499 Cardiac arrhythmia, unspecified: Secondary | ICD-10-CM | POA: Diagnosis not present

## 2023-05-01 DIAGNOSIS — M542 Cervicalgia: Secondary | ICD-10-CM | POA: Diagnosis not present

## 2023-05-08 DIAGNOSIS — I10 Essential (primary) hypertension: Secondary | ICD-10-CM | POA: Diagnosis not present

## 2023-05-08 DIAGNOSIS — I499 Cardiac arrhythmia, unspecified: Secondary | ICD-10-CM | POA: Diagnosis not present

## 2023-05-08 DIAGNOSIS — I700 Atherosclerosis of aorta: Secondary | ICD-10-CM | POA: Diagnosis not present

## 2023-05-08 DIAGNOSIS — F0280 Dementia in other diseases classified elsewhere without behavioral disturbance: Secondary | ICD-10-CM | POA: Diagnosis not present

## 2023-05-08 DIAGNOSIS — M797 Fibromyalgia: Secondary | ICD-10-CM | POA: Diagnosis not present

## 2023-05-08 DIAGNOSIS — M542 Cervicalgia: Secondary | ICD-10-CM | POA: Diagnosis not present

## 2023-05-08 DIAGNOSIS — S81811D Laceration without foreign body, right lower leg, subsequent encounter: Secondary | ICD-10-CM | POA: Diagnosis not present

## 2023-05-08 DIAGNOSIS — E039 Hypothyroidism, unspecified: Secondary | ICD-10-CM | POA: Diagnosis not present

## 2023-05-08 DIAGNOSIS — G8929 Other chronic pain: Secondary | ICD-10-CM | POA: Diagnosis not present

## 2023-05-15 DIAGNOSIS — M797 Fibromyalgia: Secondary | ICD-10-CM | POA: Diagnosis not present

## 2023-05-15 DIAGNOSIS — I499 Cardiac arrhythmia, unspecified: Secondary | ICD-10-CM | POA: Diagnosis not present

## 2023-05-15 DIAGNOSIS — E039 Hypothyroidism, unspecified: Secondary | ICD-10-CM | POA: Diagnosis not present

## 2023-05-15 DIAGNOSIS — F0280 Dementia in other diseases classified elsewhere without behavioral disturbance: Secondary | ICD-10-CM | POA: Diagnosis not present

## 2023-05-15 DIAGNOSIS — G8929 Other chronic pain: Secondary | ICD-10-CM | POA: Diagnosis not present

## 2023-05-15 DIAGNOSIS — I10 Essential (primary) hypertension: Secondary | ICD-10-CM | POA: Diagnosis not present

## 2023-05-15 DIAGNOSIS — I700 Atherosclerosis of aorta: Secondary | ICD-10-CM | POA: Diagnosis not present

## 2023-05-15 DIAGNOSIS — S81811D Laceration without foreign body, right lower leg, subsequent encounter: Secondary | ICD-10-CM | POA: Diagnosis not present

## 2023-05-15 DIAGNOSIS — M542 Cervicalgia: Secondary | ICD-10-CM | POA: Diagnosis not present

## 2023-05-22 DIAGNOSIS — I499 Cardiac arrhythmia, unspecified: Secondary | ICD-10-CM | POA: Diagnosis not present

## 2023-05-22 DIAGNOSIS — F0280 Dementia in other diseases classified elsewhere without behavioral disturbance: Secondary | ICD-10-CM | POA: Diagnosis not present

## 2023-05-22 DIAGNOSIS — M542 Cervicalgia: Secondary | ICD-10-CM | POA: Diagnosis not present

## 2023-05-22 DIAGNOSIS — I700 Atherosclerosis of aorta: Secondary | ICD-10-CM | POA: Diagnosis not present

## 2023-05-22 DIAGNOSIS — E039 Hypothyroidism, unspecified: Secondary | ICD-10-CM | POA: Diagnosis not present

## 2023-05-22 DIAGNOSIS — I10 Essential (primary) hypertension: Secondary | ICD-10-CM | POA: Diagnosis not present

## 2023-05-22 DIAGNOSIS — G8929 Other chronic pain: Secondary | ICD-10-CM | POA: Diagnosis not present

## 2023-05-22 DIAGNOSIS — S81811D Laceration without foreign body, right lower leg, subsequent encounter: Secondary | ICD-10-CM | POA: Diagnosis not present

## 2023-05-22 DIAGNOSIS — M797 Fibromyalgia: Secondary | ICD-10-CM | POA: Diagnosis not present

## 2023-05-27 DIAGNOSIS — E039 Hypothyroidism, unspecified: Secondary | ICD-10-CM | POA: Diagnosis not present

## 2023-05-27 DIAGNOSIS — I700 Atherosclerosis of aorta: Secondary | ICD-10-CM | POA: Diagnosis not present

## 2023-05-27 DIAGNOSIS — I499 Cardiac arrhythmia, unspecified: Secondary | ICD-10-CM | POA: Diagnosis not present

## 2023-05-27 DIAGNOSIS — I10 Essential (primary) hypertension: Secondary | ICD-10-CM | POA: Diagnosis not present

## 2023-05-27 DIAGNOSIS — M542 Cervicalgia: Secondary | ICD-10-CM | POA: Diagnosis not present

## 2023-05-27 DIAGNOSIS — M797 Fibromyalgia: Secondary | ICD-10-CM | POA: Diagnosis not present

## 2023-05-27 DIAGNOSIS — F0280 Dementia in other diseases classified elsewhere without behavioral disturbance: Secondary | ICD-10-CM | POA: Diagnosis not present

## 2023-05-27 DIAGNOSIS — S81811D Laceration without foreign body, right lower leg, subsequent encounter: Secondary | ICD-10-CM | POA: Diagnosis not present

## 2023-05-27 DIAGNOSIS — G8929 Other chronic pain: Secondary | ICD-10-CM | POA: Diagnosis not present

## 2023-06-05 DIAGNOSIS — I10 Essential (primary) hypertension: Secondary | ICD-10-CM | POA: Diagnosis not present

## 2023-06-05 DIAGNOSIS — I499 Cardiac arrhythmia, unspecified: Secondary | ICD-10-CM | POA: Diagnosis not present

## 2023-06-05 DIAGNOSIS — S81811D Laceration without foreign body, right lower leg, subsequent encounter: Secondary | ICD-10-CM | POA: Diagnosis not present

## 2023-06-05 DIAGNOSIS — M542 Cervicalgia: Secondary | ICD-10-CM | POA: Diagnosis not present

## 2023-06-05 DIAGNOSIS — I700 Atherosclerosis of aorta: Secondary | ICD-10-CM | POA: Diagnosis not present

## 2023-06-05 DIAGNOSIS — F0280 Dementia in other diseases classified elsewhere without behavioral disturbance: Secondary | ICD-10-CM | POA: Diagnosis not present

## 2023-06-05 DIAGNOSIS — M797 Fibromyalgia: Secondary | ICD-10-CM | POA: Diagnosis not present

## 2023-06-05 DIAGNOSIS — E039 Hypothyroidism, unspecified: Secondary | ICD-10-CM | POA: Diagnosis not present

## 2023-06-05 DIAGNOSIS — G8929 Other chronic pain: Secondary | ICD-10-CM | POA: Diagnosis not present

## 2023-06-10 DIAGNOSIS — E039 Hypothyroidism, unspecified: Secondary | ICD-10-CM | POA: Diagnosis not present

## 2023-06-10 DIAGNOSIS — E785 Hyperlipidemia, unspecified: Secondary | ICD-10-CM | POA: Diagnosis not present

## 2023-06-10 DIAGNOSIS — F0280 Dementia in other diseases classified elsewhere without behavioral disturbance: Secondary | ICD-10-CM | POA: Diagnosis not present

## 2023-06-10 DIAGNOSIS — I129 Hypertensive chronic kidney disease with stage 1 through stage 4 chronic kidney disease, or unspecified chronic kidney disease: Secondary | ICD-10-CM | POA: Diagnosis not present

## 2023-06-10 DIAGNOSIS — I10 Essential (primary) hypertension: Secondary | ICD-10-CM | POA: Diagnosis not present

## 2023-06-10 DIAGNOSIS — N183 Chronic kidney disease, stage 3 unspecified: Secondary | ICD-10-CM | POA: Diagnosis not present

## 2023-06-10 DIAGNOSIS — M797 Fibromyalgia: Secondary | ICD-10-CM | POA: Diagnosis not present

## 2023-06-10 DIAGNOSIS — Z79891 Long term (current) use of opiate analgesic: Secondary | ICD-10-CM | POA: Diagnosis not present

## 2023-06-10 DIAGNOSIS — M542 Cervicalgia: Secondary | ICD-10-CM | POA: Diagnosis not present

## 2023-06-10 DIAGNOSIS — I700 Atherosclerosis of aorta: Secondary | ICD-10-CM | POA: Diagnosis not present

## 2023-06-10 DIAGNOSIS — I499 Cardiac arrhythmia, unspecified: Secondary | ICD-10-CM | POA: Diagnosis not present

## 2023-06-10 DIAGNOSIS — M810 Age-related osteoporosis without current pathological fracture: Secondary | ICD-10-CM | POA: Diagnosis not present

## 2023-06-10 DIAGNOSIS — R413 Other amnesia: Secondary | ICD-10-CM | POA: Diagnosis not present

## 2023-06-10 DIAGNOSIS — S81811D Laceration without foreign body, right lower leg, subsequent encounter: Secondary | ICD-10-CM | POA: Diagnosis not present

## 2023-06-10 DIAGNOSIS — Z139 Encounter for screening, unspecified: Secondary | ICD-10-CM | POA: Diagnosis not present

## 2023-06-10 DIAGNOSIS — J309 Allergic rhinitis, unspecified: Secondary | ICD-10-CM | POA: Diagnosis not present

## 2023-06-10 DIAGNOSIS — G8929 Other chronic pain: Secondary | ICD-10-CM | POA: Diagnosis not present

## 2023-07-04 DIAGNOSIS — M797 Fibromyalgia: Secondary | ICD-10-CM | POA: Diagnosis not present

## 2023-07-04 DIAGNOSIS — S81811D Laceration without foreign body, right lower leg, subsequent encounter: Secondary | ICD-10-CM | POA: Diagnosis not present

## 2023-07-04 DIAGNOSIS — I499 Cardiac arrhythmia, unspecified: Secondary | ICD-10-CM | POA: Diagnosis not present

## 2023-07-04 DIAGNOSIS — F0280 Dementia in other diseases classified elsewhere without behavioral disturbance: Secondary | ICD-10-CM | POA: Diagnosis not present

## 2023-07-04 DIAGNOSIS — I700 Atherosclerosis of aorta: Secondary | ICD-10-CM | POA: Diagnosis not present

## 2023-07-04 DIAGNOSIS — I10 Essential (primary) hypertension: Secondary | ICD-10-CM | POA: Diagnosis not present

## 2023-07-04 DIAGNOSIS — E039 Hypothyroidism, unspecified: Secondary | ICD-10-CM | POA: Diagnosis not present

## 2023-07-04 DIAGNOSIS — M542 Cervicalgia: Secondary | ICD-10-CM | POA: Diagnosis not present

## 2023-07-04 DIAGNOSIS — G8929 Other chronic pain: Secondary | ICD-10-CM | POA: Diagnosis not present

## 2023-09-09 DIAGNOSIS — M810 Age-related osteoporosis without current pathological fracture: Secondary | ICD-10-CM | POA: Diagnosis not present

## 2023-09-09 DIAGNOSIS — R413 Other amnesia: Secondary | ICD-10-CM | POA: Diagnosis not present

## 2023-09-09 DIAGNOSIS — J309 Allergic rhinitis, unspecified: Secondary | ICD-10-CM | POA: Diagnosis not present

## 2023-09-09 DIAGNOSIS — D531 Other megaloblastic anemias, not elsewhere classified: Secondary | ICD-10-CM | POA: Diagnosis not present

## 2023-09-09 DIAGNOSIS — N183 Chronic kidney disease, stage 3 unspecified: Secondary | ICD-10-CM | POA: Diagnosis not present

## 2023-09-09 DIAGNOSIS — E785 Hyperlipidemia, unspecified: Secondary | ICD-10-CM | POA: Diagnosis not present

## 2023-09-09 DIAGNOSIS — I129 Hypertensive chronic kidney disease with stage 1 through stage 4 chronic kidney disease, or unspecified chronic kidney disease: Secondary | ICD-10-CM | POA: Diagnosis not present

## 2023-09-09 DIAGNOSIS — M797 Fibromyalgia: Secondary | ICD-10-CM | POA: Diagnosis not present

## 2023-09-09 DIAGNOSIS — D649 Anemia, unspecified: Secondary | ICD-10-CM | POA: Diagnosis not present

## 2023-09-12 DIAGNOSIS — D531 Other megaloblastic anemias, not elsewhere classified: Secondary | ICD-10-CM | POA: Diagnosis not present

## 2023-09-19 DIAGNOSIS — E538 Deficiency of other specified B group vitamins: Secondary | ICD-10-CM | POA: Diagnosis not present

## 2023-10-27 DIAGNOSIS — Z79899 Other long term (current) drug therapy: Secondary | ICD-10-CM | POA: Diagnosis not present

## 2023-10-27 DIAGNOSIS — S52552A Other extraarticular fracture of lower end of left radius, initial encounter for closed fracture: Secondary | ICD-10-CM | POA: Diagnosis not present

## 2023-10-27 DIAGNOSIS — R748 Abnormal levels of other serum enzymes: Secondary | ICD-10-CM | POA: Diagnosis not present

## 2023-10-27 DIAGNOSIS — J40 Bronchitis, not specified as acute or chronic: Secondary | ICD-10-CM | POA: Diagnosis not present

## 2023-10-27 DIAGNOSIS — I10 Essential (primary) hypertension: Secondary | ICD-10-CM | POA: Diagnosis not present

## 2023-10-27 DIAGNOSIS — S52502A Unspecified fracture of the lower end of left radius, initial encounter for closed fracture: Secondary | ICD-10-CM | POA: Diagnosis not present

## 2023-10-27 DIAGNOSIS — S52551A Other extraarticular fracture of lower end of right radius, initial encounter for closed fracture: Secondary | ICD-10-CM | POA: Diagnosis not present

## 2023-10-27 DIAGNOSIS — R059 Cough, unspecified: Secondary | ICD-10-CM | POA: Diagnosis not present

## 2023-10-27 DIAGNOSIS — Z888 Allergy status to other drugs, medicaments and biological substances status: Secondary | ICD-10-CM | POA: Diagnosis not present

## 2023-10-27 DIAGNOSIS — S0990XA Unspecified injury of head, initial encounter: Secondary | ICD-10-CM | POA: Diagnosis not present

## 2023-10-27 DIAGNOSIS — Z87891 Personal history of nicotine dependence: Secondary | ICD-10-CM | POA: Diagnosis not present

## 2023-10-27 DIAGNOSIS — R918 Other nonspecific abnormal finding of lung field: Secondary | ICD-10-CM | POA: Diagnosis not present

## 2023-10-27 DIAGNOSIS — Z880 Allergy status to penicillin: Secondary | ICD-10-CM | POA: Diagnosis not present

## 2023-10-27 DIAGNOSIS — R296 Repeated falls: Secondary | ICD-10-CM | POA: Diagnosis not present

## 2023-10-27 DIAGNOSIS — N3001 Acute cystitis with hematuria: Secondary | ICD-10-CM | POA: Diagnosis not present

## 2023-10-31 DIAGNOSIS — R9431 Abnormal electrocardiogram [ECG] [EKG]: Secondary | ICD-10-CM | POA: Diagnosis not present

## 2023-11-05 DIAGNOSIS — S52322D Displaced transverse fracture of shaft of left radius, subsequent encounter for closed fracture with routine healing: Secondary | ICD-10-CM | POA: Diagnosis not present

## 2023-11-05 DIAGNOSIS — S52552A Other extraarticular fracture of lower end of left radius, initial encounter for closed fracture: Secondary | ICD-10-CM | POA: Diagnosis not present

## 2023-11-05 DIAGNOSIS — M25532 Pain in left wrist: Secondary | ICD-10-CM | POA: Diagnosis not present

## 2023-12-03 DIAGNOSIS — S52552A Other extraarticular fracture of lower end of left radius, initial encounter for closed fracture: Secondary | ICD-10-CM | POA: Diagnosis not present

## 2023-12-03 DIAGNOSIS — S52502D Unspecified fracture of the lower end of left radius, subsequent encounter for closed fracture with routine healing: Secondary | ICD-10-CM | POA: Diagnosis not present

## 2023-12-12 DIAGNOSIS — S62664A Nondisplaced fracture of distal phalanx of right ring finger, initial encounter for closed fracture: Secondary | ICD-10-CM | POA: Diagnosis not present

## 2023-12-12 DIAGNOSIS — S0083XA Contusion of other part of head, initial encounter: Secondary | ICD-10-CM | POA: Diagnosis not present

## 2023-12-12 DIAGNOSIS — S62614A Displaced fracture of proximal phalanx of right ring finger, initial encounter for closed fracture: Secondary | ICD-10-CM | POA: Diagnosis not present

## 2023-12-12 DIAGNOSIS — I10 Essential (primary) hypertension: Secondary | ICD-10-CM | POA: Diagnosis not present

## 2023-12-12 DIAGNOSIS — S5001XA Contusion of right elbow, initial encounter: Secondary | ICD-10-CM | POA: Diagnosis not present

## 2023-12-12 DIAGNOSIS — M797 Fibromyalgia: Secondary | ICD-10-CM | POA: Diagnosis not present

## 2023-12-12 DIAGNOSIS — Z87891 Personal history of nicotine dependence: Secondary | ICD-10-CM | POA: Diagnosis not present

## 2023-12-12 DIAGNOSIS — S199XXA Unspecified injury of neck, initial encounter: Secondary | ICD-10-CM | POA: Diagnosis not present

## 2023-12-12 DIAGNOSIS — S0990XA Unspecified injury of head, initial encounter: Secondary | ICD-10-CM | POA: Diagnosis not present

## 2023-12-12 DIAGNOSIS — Z7902 Long term (current) use of antithrombotics/antiplatelets: Secondary | ICD-10-CM | POA: Diagnosis not present

## 2023-12-12 DIAGNOSIS — S62354A Nondisplaced fracture of shaft of fourth metacarpal bone, right hand, initial encounter for closed fracture: Secondary | ICD-10-CM | POA: Diagnosis not present

## 2023-12-12 DIAGNOSIS — Z880 Allergy status to penicillin: Secondary | ICD-10-CM | POA: Diagnosis not present

## 2023-12-12 DIAGNOSIS — S60221A Contusion of right hand, initial encounter: Secondary | ICD-10-CM | POA: Diagnosis not present

## 2023-12-14 DIAGNOSIS — M797 Fibromyalgia: Secondary | ICD-10-CM | POA: Diagnosis not present

## 2023-12-14 DIAGNOSIS — S50311D Abrasion of right elbow, subsequent encounter: Secondary | ICD-10-CM | POA: Diagnosis not present

## 2023-12-14 DIAGNOSIS — S5001XD Contusion of right elbow, subsequent encounter: Secondary | ICD-10-CM | POA: Diagnosis not present

## 2023-12-14 DIAGNOSIS — S62664D Nondisplaced fracture of distal phalanx of right ring finger, subsequent encounter for fracture with routine healing: Secondary | ICD-10-CM | POA: Diagnosis not present

## 2023-12-14 DIAGNOSIS — S60221D Contusion of right hand, subsequent encounter: Secondary | ICD-10-CM | POA: Diagnosis not present

## 2023-12-14 DIAGNOSIS — M5416 Radiculopathy, lumbar region: Secondary | ICD-10-CM | POA: Diagnosis not present

## 2023-12-14 DIAGNOSIS — I10 Essential (primary) hypertension: Secondary | ICD-10-CM | POA: Diagnosis not present

## 2023-12-14 DIAGNOSIS — S0083XD Contusion of other part of head, subsequent encounter: Secondary | ICD-10-CM | POA: Diagnosis not present

## 2023-12-17 DIAGNOSIS — S50311D Abrasion of right elbow, subsequent encounter: Secondary | ICD-10-CM | POA: Diagnosis not present

## 2023-12-17 DIAGNOSIS — S62354D Nondisplaced fracture of shaft of fourth metacarpal bone, right hand, subsequent encounter for fracture with routine healing: Secondary | ICD-10-CM | POA: Diagnosis not present

## 2023-12-17 DIAGNOSIS — M5416 Radiculopathy, lumbar region: Secondary | ICD-10-CM | POA: Diagnosis not present

## 2023-12-17 DIAGNOSIS — E785 Hyperlipidemia, unspecified: Secondary | ICD-10-CM | POA: Diagnosis not present

## 2023-12-17 DIAGNOSIS — R2681 Unsteadiness on feet: Secondary | ICD-10-CM | POA: Diagnosis not present

## 2023-12-17 DIAGNOSIS — S0083XD Contusion of other part of head, subsequent encounter: Secondary | ICD-10-CM | POA: Diagnosis not present

## 2023-12-17 DIAGNOSIS — N183 Chronic kidney disease, stage 3 unspecified: Secondary | ICD-10-CM | POA: Diagnosis not present

## 2023-12-17 DIAGNOSIS — S5001XD Contusion of right elbow, subsequent encounter: Secondary | ICD-10-CM | POA: Diagnosis not present

## 2023-12-17 DIAGNOSIS — I129 Hypertensive chronic kidney disease with stage 1 through stage 4 chronic kidney disease, or unspecified chronic kidney disease: Secondary | ICD-10-CM | POA: Diagnosis not present

## 2023-12-17 DIAGNOSIS — S62669A Nondisplaced fracture of distal phalanx of unspecified finger, initial encounter for closed fracture: Secondary | ICD-10-CM | POA: Diagnosis not present

## 2023-12-17 DIAGNOSIS — S60221D Contusion of right hand, subsequent encounter: Secondary | ICD-10-CM | POA: Diagnosis not present

## 2023-12-17 DIAGNOSIS — Z789 Other specified health status: Secondary | ICD-10-CM | POA: Diagnosis not present

## 2023-12-17 DIAGNOSIS — R296 Repeated falls: Secondary | ICD-10-CM | POA: Diagnosis not present

## 2023-12-17 DIAGNOSIS — S62664D Nondisplaced fracture of distal phalanx of right ring finger, subsequent encounter for fracture with routine healing: Secondary | ICD-10-CM | POA: Diagnosis not present

## 2023-12-17 DIAGNOSIS — M797 Fibromyalgia: Secondary | ICD-10-CM | POA: Diagnosis not present

## 2023-12-17 DIAGNOSIS — S62354A Nondisplaced fracture of shaft of fourth metacarpal bone, right hand, initial encounter for closed fracture: Secondary | ICD-10-CM | POA: Diagnosis not present

## 2023-12-17 DIAGNOSIS — I10 Essential (primary) hypertension: Secondary | ICD-10-CM | POA: Diagnosis not present

## 2023-12-18 DIAGNOSIS — S52352E Displaced comminuted fracture of shaft of radius, left arm, subsequent encounter for open fracture type I or II with routine healing: Secondary | ICD-10-CM | POA: Diagnosis not present

## 2023-12-18 DIAGNOSIS — Z743 Need for continuous supervision: Secondary | ICD-10-CM | POA: Diagnosis not present

## 2023-12-18 DIAGNOSIS — S52352A Displaced comminuted fracture of shaft of radius, left arm, initial encounter for closed fracture: Secondary | ICD-10-CM | POA: Diagnosis not present

## 2023-12-18 DIAGNOSIS — F419 Anxiety disorder, unspecified: Secondary | ICD-10-CM | POA: Diagnosis not present

## 2023-12-18 DIAGNOSIS — Z87891 Personal history of nicotine dependence: Secondary | ICD-10-CM | POA: Diagnosis not present

## 2023-12-18 DIAGNOSIS — W19XXXA Unspecified fall, initial encounter: Secondary | ICD-10-CM | POA: Diagnosis not present

## 2023-12-18 DIAGNOSIS — R296 Repeated falls: Secondary | ICD-10-CM | POA: Diagnosis not present

## 2023-12-18 DIAGNOSIS — M79603 Pain in arm, unspecified: Secondary | ICD-10-CM | POA: Diagnosis not present

## 2023-12-18 DIAGNOSIS — M797 Fibromyalgia: Secondary | ICD-10-CM | POA: Diagnosis not present

## 2023-12-18 DIAGNOSIS — I10 Essential (primary) hypertension: Secondary | ICD-10-CM | POA: Diagnosis not present

## 2023-12-18 DIAGNOSIS — S42352A Displaced comminuted fracture of shaft of humerus, left arm, initial encounter for closed fracture: Secondary | ICD-10-CM | POA: Diagnosis not present

## 2023-12-18 DIAGNOSIS — Z79899 Other long term (current) drug therapy: Secondary | ICD-10-CM | POA: Diagnosis not present

## 2023-12-18 DIAGNOSIS — M25519 Pain in unspecified shoulder: Secondary | ICD-10-CM | POA: Diagnosis not present

## 2023-12-18 DIAGNOSIS — R42 Dizziness and giddiness: Secondary | ICD-10-CM | POA: Diagnosis not present

## 2023-12-18 DIAGNOSIS — Z9181 History of falling: Secondary | ICD-10-CM | POA: Diagnosis not present

## 2023-12-18 DIAGNOSIS — M5414 Radiculopathy, thoracic region: Secondary | ICD-10-CM | POA: Diagnosis not present

## 2023-12-18 DIAGNOSIS — Z880 Allergy status to penicillin: Secondary | ICD-10-CM | POA: Diagnosis not present

## 2023-12-18 DIAGNOSIS — S42292A Other displaced fracture of upper end of left humerus, initial encounter for closed fracture: Secondary | ICD-10-CM | POA: Diagnosis not present

## 2023-12-18 DIAGNOSIS — S42215A Unspecified nondisplaced fracture of surgical neck of left humerus, initial encounter for closed fracture: Secondary | ICD-10-CM | POA: Diagnosis not present

## 2023-12-18 DIAGNOSIS — S52502D Unspecified fracture of the lower end of left radius, subsequent encounter for closed fracture with routine healing: Secondary | ICD-10-CM | POA: Diagnosis not present

## 2023-12-18 DIAGNOSIS — M5134 Other intervertebral disc degeneration, thoracic region: Secondary | ICD-10-CM | POA: Diagnosis not present

## 2023-12-19 DIAGNOSIS — S52352E Displaced comminuted fracture of shaft of radius, left arm, subsequent encounter for open fracture type I or II with routine healing: Secondary | ICD-10-CM | POA: Diagnosis not present

## 2023-12-19 DIAGNOSIS — S42302A Unspecified fracture of shaft of humerus, left arm, initial encounter for closed fracture: Secondary | ICD-10-CM | POA: Diagnosis not present

## 2023-12-19 DIAGNOSIS — S42215A Unspecified nondisplaced fracture of surgical neck of left humerus, initial encounter for closed fracture: Secondary | ICD-10-CM | POA: Diagnosis not present

## 2023-12-19 DIAGNOSIS — Z9181 History of falling: Secondary | ICD-10-CM | POA: Diagnosis not present

## 2023-12-19 DIAGNOSIS — R296 Repeated falls: Secondary | ICD-10-CM | POA: Diagnosis not present

## 2023-12-19 DIAGNOSIS — I10 Essential (primary) hypertension: Secondary | ICD-10-CM | POA: Diagnosis not present

## 2023-12-19 DIAGNOSIS — M5410 Radiculopathy, site unspecified: Secondary | ICD-10-CM | POA: Diagnosis not present

## 2023-12-19 DIAGNOSIS — F419 Anxiety disorder, unspecified: Secondary | ICD-10-CM | POA: Diagnosis not present

## 2023-12-19 DIAGNOSIS — M797 Fibromyalgia: Secondary | ICD-10-CM | POA: Diagnosis not present

## 2023-12-19 DIAGNOSIS — Z87891 Personal history of nicotine dependence: Secondary | ICD-10-CM | POA: Diagnosis not present

## 2023-12-19 DIAGNOSIS — W19XXXA Unspecified fall, initial encounter: Secondary | ICD-10-CM | POA: Diagnosis not present

## 2023-12-19 DIAGNOSIS — S42212A Unspecified displaced fracture of surgical neck of left humerus, initial encounter for closed fracture: Secondary | ICD-10-CM | POA: Diagnosis not present

## 2023-12-19 DIAGNOSIS — Z79899 Other long term (current) drug therapy: Secondary | ICD-10-CM | POA: Diagnosis not present

## 2023-12-19 DIAGNOSIS — S62304A Unspecified fracture of fourth metacarpal bone, right hand, initial encounter for closed fracture: Secondary | ICD-10-CM | POA: Diagnosis not present

## 2023-12-19 DIAGNOSIS — S62614A Displaced fracture of proximal phalanx of right ring finger, initial encounter for closed fracture: Secondary | ICD-10-CM | POA: Diagnosis not present

## 2023-12-19 DIAGNOSIS — S42292A Other displaced fracture of upper end of left humerus, initial encounter for closed fracture: Secondary | ICD-10-CM | POA: Diagnosis not present

## 2023-12-19 DIAGNOSIS — Z880 Allergy status to penicillin: Secondary | ICD-10-CM | POA: Diagnosis not present

## 2023-12-20 DIAGNOSIS — M5410 Radiculopathy, site unspecified: Secondary | ICD-10-CM | POA: Diagnosis not present

## 2023-12-20 DIAGNOSIS — I10 Essential (primary) hypertension: Secondary | ICD-10-CM | POA: Diagnosis not present

## 2023-12-20 DIAGNOSIS — F419 Anxiety disorder, unspecified: Secondary | ICD-10-CM | POA: Diagnosis not present

## 2023-12-20 DIAGNOSIS — S42215A Unspecified nondisplaced fracture of surgical neck of left humerus, initial encounter for closed fracture: Secondary | ICD-10-CM | POA: Diagnosis not present

## 2023-12-20 DIAGNOSIS — R296 Repeated falls: Secondary | ICD-10-CM | POA: Diagnosis not present

## 2023-12-20 DIAGNOSIS — Z79899 Other long term (current) drug therapy: Secondary | ICD-10-CM | POA: Diagnosis not present

## 2023-12-21 DIAGNOSIS — S62301A Unspecified fracture of second metacarpal bone, left hand, initial encounter for closed fracture: Secondary | ICD-10-CM | POA: Diagnosis not present

## 2023-12-21 DIAGNOSIS — S42202D Unspecified fracture of upper end of left humerus, subsequent encounter for fracture with routine healing: Secondary | ICD-10-CM | POA: Diagnosis not present

## 2023-12-21 DIAGNOSIS — S62614D Displaced fracture of proximal phalanx of right ring finger, subsequent encounter for fracture with routine healing: Secondary | ICD-10-CM | POA: Diagnosis not present

## 2023-12-21 DIAGNOSIS — S42212D Unspecified displaced fracture of surgical neck of left humerus, subsequent encounter for fracture with routine healing: Secondary | ICD-10-CM | POA: Diagnosis not present

## 2023-12-21 DIAGNOSIS — F419 Anxiety disorder, unspecified: Secondary | ICD-10-CM | POA: Diagnosis not present

## 2023-12-21 DIAGNOSIS — I10 Essential (primary) hypertension: Secondary | ICD-10-CM | POA: Diagnosis not present

## 2023-12-21 DIAGNOSIS — M25512 Pain in left shoulder: Secondary | ICD-10-CM | POA: Diagnosis not present

## 2023-12-21 DIAGNOSIS — S42292A Other displaced fracture of upper end of left humerus, initial encounter for closed fracture: Secondary | ICD-10-CM | POA: Diagnosis not present

## 2023-12-21 DIAGNOSIS — S62324A Displaced fracture of shaft of fourth metacarpal bone, right hand, initial encounter for closed fracture: Secondary | ICD-10-CM | POA: Diagnosis not present

## 2023-12-21 DIAGNOSIS — Z880 Allergy status to penicillin: Secondary | ICD-10-CM | POA: Diagnosis not present

## 2023-12-21 DIAGNOSIS — S42215A Unspecified nondisplaced fracture of surgical neck of left humerus, initial encounter for closed fracture: Secondary | ICD-10-CM | POA: Diagnosis not present

## 2023-12-21 DIAGNOSIS — S42212A Unspecified displaced fracture of surgical neck of left humerus, initial encounter for closed fracture: Secondary | ICD-10-CM | POA: Diagnosis not present

## 2023-12-21 DIAGNOSIS — M5410 Radiculopathy, site unspecified: Secondary | ICD-10-CM | POA: Diagnosis not present

## 2023-12-21 DIAGNOSIS — R296 Repeated falls: Secondary | ICD-10-CM | POA: Diagnosis not present

## 2023-12-21 DIAGNOSIS — M797 Fibromyalgia: Secondary | ICD-10-CM | POA: Diagnosis not present

## 2023-12-21 DIAGNOSIS — S62304A Unspecified fracture of fourth metacarpal bone, right hand, initial encounter for closed fracture: Secondary | ICD-10-CM | POA: Diagnosis not present

## 2023-12-21 DIAGNOSIS — M79641 Pain in right hand: Secondary | ICD-10-CM | POA: Diagnosis not present

## 2023-12-21 DIAGNOSIS — S62614A Displaced fracture of proximal phalanx of right ring finger, initial encounter for closed fracture: Secondary | ICD-10-CM | POA: Diagnosis not present

## 2023-12-21 DIAGNOSIS — Z79899 Other long term (current) drug therapy: Secondary | ICD-10-CM | POA: Diagnosis not present

## 2023-12-23 DIAGNOSIS — M79641 Pain in right hand: Secondary | ICD-10-CM | POA: Diagnosis not present

## 2023-12-23 DIAGNOSIS — S42292A Other displaced fracture of upper end of left humerus, initial encounter for closed fracture: Secondary | ICD-10-CM | POA: Diagnosis not present

## 2023-12-23 DIAGNOSIS — M25512 Pain in left shoulder: Secondary | ICD-10-CM | POA: Diagnosis not present

## 2023-12-23 DIAGNOSIS — S62324A Displaced fracture of shaft of fourth metacarpal bone, right hand, initial encounter for closed fracture: Secondary | ICD-10-CM | POA: Diagnosis not present

## 2023-12-24 DIAGNOSIS — I10 Essential (primary) hypertension: Secondary | ICD-10-CM | POA: Diagnosis not present

## 2023-12-24 DIAGNOSIS — S42212D Unspecified displaced fracture of surgical neck of left humerus, subsequent encounter for fracture with routine healing: Secondary | ICD-10-CM | POA: Diagnosis not present

## 2023-12-24 DIAGNOSIS — R296 Repeated falls: Secondary | ICD-10-CM | POA: Diagnosis not present

## 2023-12-24 DIAGNOSIS — M5410 Radiculopathy, site unspecified: Secondary | ICD-10-CM | POA: Diagnosis not present

## 2023-12-31 DIAGNOSIS — R296 Repeated falls: Secondary | ICD-10-CM | POA: Diagnosis not present

## 2023-12-31 DIAGNOSIS — M5410 Radiculopathy, site unspecified: Secondary | ICD-10-CM | POA: Diagnosis not present

## 2023-12-31 DIAGNOSIS — S62301A Unspecified fracture of second metacarpal bone, left hand, initial encounter for closed fracture: Secondary | ICD-10-CM | POA: Diagnosis not present

## 2023-12-31 DIAGNOSIS — I10 Essential (primary) hypertension: Secondary | ICD-10-CM | POA: Diagnosis not present

## 2023-12-31 DIAGNOSIS — S42212A Unspecified displaced fracture of surgical neck of left humerus, initial encounter for closed fracture: Secondary | ICD-10-CM | POA: Diagnosis not present

## 2024-01-02 DIAGNOSIS — S42212A Unspecified displaced fracture of surgical neck of left humerus, initial encounter for closed fracture: Secondary | ICD-10-CM | POA: Diagnosis not present

## 2024-01-02 DIAGNOSIS — I10 Essential (primary) hypertension: Secondary | ICD-10-CM | POA: Diagnosis not present

## 2024-01-03 DIAGNOSIS — S5001XD Contusion of right elbow, subsequent encounter: Secondary | ICD-10-CM | POA: Diagnosis not present

## 2024-01-07 DIAGNOSIS — M5416 Radiculopathy, lumbar region: Secondary | ICD-10-CM | POA: Diagnosis not present

## 2024-01-08 DIAGNOSIS — S62669A Nondisplaced fracture of distal phalanx of unspecified finger, initial encounter for closed fracture: Secondary | ICD-10-CM | POA: Diagnosis not present

## 2024-01-08 DIAGNOSIS — S42215A Unspecified nondisplaced fracture of surgical neck of left humerus, initial encounter for closed fracture: Secondary | ICD-10-CM | POA: Diagnosis not present

## 2024-01-08 DIAGNOSIS — R296 Repeated falls: Secondary | ICD-10-CM | POA: Diagnosis not present

## 2024-01-08 DIAGNOSIS — R2681 Unsteadiness on feet: Secondary | ICD-10-CM | POA: Diagnosis not present

## 2024-01-08 DIAGNOSIS — S62354A Nondisplaced fracture of shaft of fourth metacarpal bone, right hand, initial encounter for closed fracture: Secondary | ICD-10-CM | POA: Diagnosis not present

## 2024-01-08 DIAGNOSIS — I129 Hypertensive chronic kidney disease with stage 1 through stage 4 chronic kidney disease, or unspecified chronic kidney disease: Secondary | ICD-10-CM | POA: Diagnosis not present

## 2024-01-08 DIAGNOSIS — Z789 Other specified health status: Secondary | ICD-10-CM | POA: Diagnosis not present

## 2024-01-08 DIAGNOSIS — Z7409 Other reduced mobility: Secondary | ICD-10-CM | POA: Diagnosis not present

## 2024-01-09 DIAGNOSIS — S62354D Nondisplaced fracture of shaft of fourth metacarpal bone, right hand, subsequent encounter for fracture with routine healing: Secondary | ICD-10-CM | POA: Diagnosis not present

## 2024-01-09 DIAGNOSIS — S50311D Abrasion of right elbow, subsequent encounter: Secondary | ICD-10-CM | POA: Diagnosis not present

## 2024-01-09 DIAGNOSIS — S60221D Contusion of right hand, subsequent encounter: Secondary | ICD-10-CM | POA: Diagnosis not present

## 2024-01-09 DIAGNOSIS — S62664D Nondisplaced fracture of distal phalanx of right ring finger, subsequent encounter for fracture with routine healing: Secondary | ICD-10-CM | POA: Diagnosis not present

## 2024-01-09 DIAGNOSIS — M5416 Radiculopathy, lumbar region: Secondary | ICD-10-CM | POA: Diagnosis not present

## 2024-01-09 DIAGNOSIS — M797 Fibromyalgia: Secondary | ICD-10-CM | POA: Diagnosis not present

## 2024-01-09 DIAGNOSIS — S0083XD Contusion of other part of head, subsequent encounter: Secondary | ICD-10-CM | POA: Diagnosis not present

## 2024-01-16 DIAGNOSIS — I10 Essential (primary) hypertension: Secondary | ICD-10-CM | POA: Diagnosis not present

## 2024-01-16 DIAGNOSIS — S42212D Unspecified displaced fracture of surgical neck of left humerus, subsequent encounter for fracture with routine healing: Secondary | ICD-10-CM | POA: Diagnosis not present

## 2024-01-16 DIAGNOSIS — M797 Fibromyalgia: Secondary | ICD-10-CM | POA: Diagnosis not present

## 2024-01-16 DIAGNOSIS — Z87891 Personal history of nicotine dependence: Secondary | ICD-10-CM | POA: Diagnosis not present

## 2024-01-16 DIAGNOSIS — F419 Anxiety disorder, unspecified: Secondary | ICD-10-CM | POA: Diagnosis not present

## 2024-01-16 DIAGNOSIS — G549 Nerve root and plexus disorder, unspecified: Secondary | ICD-10-CM | POA: Diagnosis not present

## 2024-01-16 DIAGNOSIS — S62354D Nondisplaced fracture of shaft of fourth metacarpal bone, right hand, subsequent encounter for fracture with routine healing: Secondary | ICD-10-CM | POA: Diagnosis not present

## 2024-01-16 DIAGNOSIS — S62664D Nondisplaced fracture of distal phalanx of right ring finger, subsequent encounter for fracture with routine healing: Secondary | ICD-10-CM | POA: Diagnosis not present

## 2024-01-16 DIAGNOSIS — M5416 Radiculopathy, lumbar region: Secondary | ICD-10-CM | POA: Diagnosis not present

## 2024-01-22 DIAGNOSIS — S62324D Displaced fracture of shaft of fourth metacarpal bone, right hand, subsequent encounter for fracture with routine healing: Secondary | ICD-10-CM | POA: Diagnosis not present

## 2024-01-22 DIAGNOSIS — S62614D Displaced fracture of proximal phalanx of right ring finger, subsequent encounter for fracture with routine healing: Secondary | ICD-10-CM | POA: Diagnosis not present

## 2024-01-22 DIAGNOSIS — S62664D Nondisplaced fracture of distal phalanx of right ring finger, subsequent encounter for fracture with routine healing: Secondary | ICD-10-CM | POA: Diagnosis not present

## 2024-01-22 DIAGNOSIS — S42292D Other displaced fracture of upper end of left humerus, subsequent encounter for fracture with routine healing: Secondary | ICD-10-CM | POA: Diagnosis not present

## 2024-01-22 DIAGNOSIS — Z87891 Personal history of nicotine dependence: Secondary | ICD-10-CM | POA: Diagnosis not present

## 2024-01-22 DIAGNOSIS — G549 Nerve root and plexus disorder, unspecified: Secondary | ICD-10-CM | POA: Diagnosis not present

## 2024-01-22 DIAGNOSIS — M797 Fibromyalgia: Secondary | ICD-10-CM | POA: Diagnosis not present

## 2024-01-22 DIAGNOSIS — S62354D Nondisplaced fracture of shaft of fourth metacarpal bone, right hand, subsequent encounter for fracture with routine healing: Secondary | ICD-10-CM | POA: Diagnosis not present

## 2024-01-22 DIAGNOSIS — F419 Anxiety disorder, unspecified: Secondary | ICD-10-CM | POA: Diagnosis not present

## 2024-01-22 DIAGNOSIS — M5416 Radiculopathy, lumbar region: Secondary | ICD-10-CM | POA: Diagnosis not present

## 2024-01-22 DIAGNOSIS — I10 Essential (primary) hypertension: Secondary | ICD-10-CM | POA: Diagnosis not present

## 2024-01-22 DIAGNOSIS — S42212D Unspecified displaced fracture of surgical neck of left humerus, subsequent encounter for fracture with routine healing: Secondary | ICD-10-CM | POA: Diagnosis not present

## 2024-01-22 DIAGNOSIS — Z981 Arthrodesis status: Secondary | ICD-10-CM | POA: Diagnosis not present

## 2024-01-27 DIAGNOSIS — F419 Anxiety disorder, unspecified: Secondary | ICD-10-CM | POA: Diagnosis not present

## 2024-01-27 DIAGNOSIS — S42212D Unspecified displaced fracture of surgical neck of left humerus, subsequent encounter for fracture with routine healing: Secondary | ICD-10-CM | POA: Diagnosis not present

## 2024-01-27 DIAGNOSIS — S62664D Nondisplaced fracture of distal phalanx of right ring finger, subsequent encounter for fracture with routine healing: Secondary | ICD-10-CM | POA: Diagnosis not present

## 2024-01-27 DIAGNOSIS — G549 Nerve root and plexus disorder, unspecified: Secondary | ICD-10-CM | POA: Diagnosis not present

## 2024-01-27 DIAGNOSIS — Z87891 Personal history of nicotine dependence: Secondary | ICD-10-CM | POA: Diagnosis not present

## 2024-01-27 DIAGNOSIS — M797 Fibromyalgia: Secondary | ICD-10-CM | POA: Diagnosis not present

## 2024-01-27 DIAGNOSIS — S62354D Nondisplaced fracture of shaft of fourth metacarpal bone, right hand, subsequent encounter for fracture with routine healing: Secondary | ICD-10-CM | POA: Diagnosis not present

## 2024-01-27 DIAGNOSIS — I10 Essential (primary) hypertension: Secondary | ICD-10-CM | POA: Diagnosis not present

## 2024-01-27 DIAGNOSIS — M5416 Radiculopathy, lumbar region: Secondary | ICD-10-CM | POA: Diagnosis not present

## 2024-02-24 DIAGNOSIS — M85812 Other specified disorders of bone density and structure, left shoulder: Secondary | ICD-10-CM | POA: Diagnosis not present

## 2024-02-24 DIAGNOSIS — S42292D Other displaced fracture of upper end of left humerus, subsequent encounter for fracture with routine healing: Secondary | ICD-10-CM | POA: Diagnosis not present

## 2024-02-24 DIAGNOSIS — S62614D Displaced fracture of proximal phalanx of right ring finger, subsequent encounter for fracture with routine healing: Secondary | ICD-10-CM | POA: Diagnosis not present

## 2024-02-24 DIAGNOSIS — S62324D Displaced fracture of shaft of fourth metacarpal bone, right hand, subsequent encounter for fracture with routine healing: Secondary | ICD-10-CM | POA: Diagnosis not present
